# Patient Record
Sex: Male | Born: 1945 | Race: White | Hispanic: No | Marital: Married | Smoking: Never smoker
Health system: Southern US, Community
[De-identification: ages and names within clinical notes are randomized; demographics above are authoritative.]

## PROBLEM LIST (undated history)

## (undated) DIAGNOSIS — K219 Gastro-esophageal reflux disease without esophagitis: Secondary | ICD-10-CM

## (undated) DIAGNOSIS — E785 Hyperlipidemia, unspecified: Secondary | ICD-10-CM

## (undated) DIAGNOSIS — IMO0001 Reserved for inherently not codable concepts without codable children: Secondary | ICD-10-CM

## (undated) DIAGNOSIS — M109 Gout, unspecified: Secondary | ICD-10-CM

## (undated) DIAGNOSIS — E079 Disorder of thyroid, unspecified: Secondary | ICD-10-CM

## (undated) HISTORY — PX: CERVICAL FUSION: SHX112

## (undated) HISTORY — PX: KNEE ARTHROSCOPY: SUR90

## (undated) HISTORY — PX: THYROIDECTOMY, PARTIAL: SHX18

## (undated) HISTORY — PX: RHINOPLASTY: SUR1284

---

## 2003-08-26 HISTORY — PX: HERNIA REPAIR: SHX51

## 2006-08-25 HISTORY — PX: FOOT SURGERY: SHX648

## 2008-08-25 HISTORY — PX: LAMINECTOMY AND MICRODISCECTOMY LUMBAR SPINE: SHX1913

## 2008-10-11 ENCOUNTER — Ambulatory Visit (HOSPITAL_BASED_OUTPATIENT_CLINIC_OR_DEPARTMENT_OTHER): Admission: RE | Admit: 2008-10-11 | Discharge: 2008-10-11 | Payer: Self-pay | Admitting: Orthopedic Surgery

## 2009-04-13 ENCOUNTER — Encounter: Admission: RE | Admit: 2009-04-13 | Discharge: 2009-04-13 | Payer: Self-pay | Admitting: Orthopedic Surgery

## 2009-05-08 ENCOUNTER — Ambulatory Visit: Payer: Self-pay | Admitting: Family Medicine

## 2009-05-08 DIAGNOSIS — J069 Acute upper respiratory infection, unspecified: Secondary | ICD-10-CM | POA: Insufficient documentation

## 2009-05-18 ENCOUNTER — Ambulatory Visit: Payer: Self-pay | Admitting: Family Medicine

## 2009-05-18 DIAGNOSIS — M25539 Pain in unspecified wrist: Secondary | ICD-10-CM | POA: Insufficient documentation

## 2009-05-23 ENCOUNTER — Ambulatory Visit: Payer: Self-pay | Admitting: Family Medicine

## 2009-05-23 DIAGNOSIS — J309 Allergic rhinitis, unspecified: Secondary | ICD-10-CM | POA: Insufficient documentation

## 2009-05-23 DIAGNOSIS — E785 Hyperlipidemia, unspecified: Secondary | ICD-10-CM | POA: Insufficient documentation

## 2009-09-26 ENCOUNTER — Ambulatory Visit: Payer: Self-pay | Admitting: Family Medicine

## 2009-10-18 ENCOUNTER — Ambulatory Visit (HOSPITAL_COMMUNITY): Admission: RE | Admit: 2009-10-18 | Discharge: 2009-10-20 | Payer: Self-pay | Admitting: Orthopedic Surgery

## 2009-10-24 ENCOUNTER — Inpatient Hospital Stay (HOSPITAL_COMMUNITY): Admission: EM | Admit: 2009-10-24 | Discharge: 2009-10-27 | Payer: Self-pay | Admitting: Emergency Medicine

## 2010-09-16 ENCOUNTER — Encounter: Payer: Self-pay | Admitting: Orthopedic Surgery

## 2010-09-24 NOTE — Assessment & Plan Note (Signed)
Summary: neck pain x last night rm 3   Vital Signs:  Patient Profile:   65 Years Old Male CC:      neck pain x last night Height:     71 inches Weight:      223 pounds O2 Sat:      100 % O2 treatment:    Room Air Temp:     98.3 degrees F oral Pulse rate:   80 / minute Pulse rhythm:   regular Resp:     16 per minute BP sitting:   129 / 80  (right arm) Cuff size:   regular  Vitals Entered By: Areta Haber CMA (September 26, 2009 6:12 PM)                  Prior Medication List:  FLOMAX 0.4 MG CAPS (TAMSULOSIN HCL) 1 qd CRESTOR 40 MG TABS (ROSUVASTATIN CALCIUM) 1qd LEVOTHROID 125 MCG TABS (LEVOTHYROXINE SODIUM) 1qd CIMETIDINE 200 MG TABS (CIMETIDINE) 1qd TUSSICAPS 10-8 MG XR12H-CAP (HYDROCOD POLST-CHLORPHEN POLST) One by mouth hs as needed cough AZITHROMYCIN 250 MG TABS (AZITHROMYCIN) Two tabs by mouth on day 1, then 1 tab daily on days 2 through 5 PREDNISONE 10 MG TABS (PREDNISONE) 2 PO today, then 2 BID for 2 days, then 1 two times a day for 2 days, then 1 daily for 2 days.  Take PC LORTAB 5 5-500 MG TABS (HYDROCODONE-ACETAMINOPHEN) One or two tabs by mouth hs as needed pain COLCHICINE 0.6 MG TABS (COLCHICINE) 1 by mouth 2-4x aday next 2-3 days and then 1-2 x a day OXYCODONE-ACETAMINOPHEN 5-325 MG TABS (OXYCODONE-ACETAMINOPHEN) 1 by mouth up to 4 times per day as needed for pain prn   Current Allergies: ! PENICILLIN ! ASA  History of Present Illness Chief Complaint: neck pain x last night History of Present Illness: Started having some pain on the Rside o his neck but today the pain has moved to the L side and much worse. He has difficulty turning his head. he has had cervical spine fusion in 94-95 C4-C5 He had a MRI in April and a mylegram this fall.  Current Problems: MYALGIA/MYOSITIS (ICD-729.1) MUSCLE SPASM (ICD-728.85) ALLERGIC RHINITIS (ICD-477.9) HYPERLIPIDEMIA (ICD-272.4) WRIST PAIN, LEFT (ICD-719.43) URI (ICD-465.9)   Current Meds FLOMAX 0.4 MG CAPS  (TAMSULOSIN HCL) 1 qd CRESTOR 40 MG TABS (ROSUVASTATIN CALCIUM) 1qd LEVOTHROID 125 MCG TABS (LEVOTHYROXINE SODIUM) 1qd CIMETIDINE 200 MG TABS (CIMETIDINE) 1qd HYDROCODONE-ACETAMINOPHEN 5-500 MG TABS (HYDROCODONE-ACETAMINOPHEN) 1 tab Q4-6 hrs prn HYDROCODONE-ACETAMINOPHEN 5-325 MG TABS (HYDROCODONE-ACETAMINOPHEN) sig 1po q6-8hrs as needed ORPHENADRINE CITRATE CR 100 MG XR12H-TAB (ORPHENADRINE CITRATE) 1 by mouth twice adqay  REVIEW OF SYSTEMS Constitutional Symptoms      Denies fever, chills, night sweats, weight loss, weight gain, and fatigue.  Eyes       Denies change in vision, eye pain, eye discharge, glasses, contact lenses, and eye surgery. Ear/Nose/Throat/Mouth       Denies hearing loss/aids, change in hearing, ear pain, ear discharge, dizziness, frequent runny nose, frequent nose bleeds, sinus problems, sore throat, hoarseness, and tooth pain or bleeding.  Respiratory       Denies dry cough, productive cough, wheezing, shortness of breath, asthma, bronchitis, and emphysema/COPD.  Cardiovascular       Denies murmurs, chest pain, and tires easily with exhertion.    Gastrointestinal       Denies stomach pain, nausea/vomiting, diarrhea, constipation, blood in bowel movements, and indigestion. Genitourniary       Denies painful urination, kidney stones, and loss  of urinary control. Neurological       Denies paralysis, seizures, and fainting/blackouts. Musculoskeletal       Complains of muscle pain, joint pain, and decreased range of motion.      Denies joint stiffness, redness, swelling, muscle weakness, and gout.      Comments: neck pain x last night Skin       Denies bruising, unusual mles/lumps or sores, and hair/skin or nail changes.  Psych       Denies mood changes, temper/anger issues, anxiety/stress, speech problems, depression, and sleep problems.  Past History:  Past Medical History: Last updated: 05/23/2009 Thyroid disease Hyperlipidemia Prostatic Acid  Reflux  Past Surgical History: Last updated: 05/23/2009 C6-7 fusion Thyroidectomy - hemi Knee surgery - March/2010 Physical Exam General appearance: well developed, well nourished, moderate distress Head: normocephalic, atraumatic Neck: tenderness over the muscle on the L side Skin: seborrhea present MSE: oriented to time, place, and person Assessment New Problems: MYALGIA/MYOSITIS (ICD-729.1) MUSCLE SPASM (ICD-728.85)  muscle spasm   Patient Education: Patient and/or caregiver instructed in the following: rest. ice pack  Plan New Medications/Changes: ORPHENADRINE CITRATE CR 100 MG XR12H-TAB (ORPHENADRINE CITRATE) 1 by mouth twice adqay  #30 x 0, 09/26/2009, Hassan Rowan MD HYDROCODONE-ACETAMINOPHEN 5-325 MG TABS (HYDROCODONE-ACETAMINOPHEN) sig 1po q6-8hrs as needed  #20 x 0, 09/26/2009, Hassan Rowan MD  New Orders: Est. Patient Level III [[address]109] Admin of Therapeutic Inj (IM or [place]) [60454] Ketorolac-Toradol 15mg  [J1885] Admin of Therapeutic Inj  intramuscular or subcutaneous [96372] Planning Comments:   continue w/antinflamatory and addmuscle relaxant  Follow Up: Follow up in 2-3 days if no improvement, Follow up on an as needed basis, Follow up with Primary Physician  The patient and/or caregiver has been counseled thoroughly with regard to medications prescribed including dosage, schedule, interactions, rationale for use, and possible side effects and they verbalize understanding.  Diagnoses and expected course of recovery discussed and will return if not improved as expected or if the condition worsens. Patient and/or caregiver verbalized understanding.  Prescriptions: ORPHENADRINE CITRATE CR 100 MG XR12H-TAB (ORPHENADRINE CITRATE) 1 by mouth twice adqay  #30 x 0   Entered and Authorized by:   Hassan Rowan MD   Signed by:   Hassan Rowan MD on 09/26/2009   Method used:   Printed then faxed to ...       [facility] [address] [phone]* (retail)       [address]       [place], [place]  [address]       Ph: [phone]       Fax: [phone]   RxID:   [id number] HYDROCODONE-ACETAMINOPHEN 5-325 MG TABS (HYDROCODONE-ACETAMINOPHEN) sig 1po q6-8hrs as needed  #20 x 0   Entered and Authorized by:   Hassan Rowan MD   Signed by:   Hassan Rowan MD on 09/26/2009   Method used:   Printed then faxed to ...       [facility] [address] [phone]* (retail)       [address]       [place], [place]  [address]       Ph: [phone]       Fax: [phone]   RxID:   [id number]   Patient Instructions: 1)  Please schedule a follow-up appointment as needed. 2)  Please schedule an appointment with your primary doctor in :as scheduled next weekfor back follow up 3)  Most patients (90%) with low back pain will improve with time (2-6 weeks).  Keep active but avoid activities that are painful. Apply moist heat and/or ice to lower back several times a day. 4)  use ice and heat for muscle spasm   Medication Administration  Injection # 1:    Medication: Ketorolac-Toradol 15mg     Diagnosis: MYALGIA/MYOSITIS (ICD-729.1)    Route: IM    Site: LUOQ gluteus    Exp Date: 09/31/2012    Lot #: [id number]    Mfr: Baxter    Comments: Administered 60 mg    Patient tolerated injection without complications    Given by: Areta Haber CMA (September 26, 2009 7:05 PM)  Orders Added: 1)  Est. Patient Level III [81191] 2)  Admin of Therapeutic Inj (IM or [place]) [47829] 3)  Ketorolac-Toradol 15mg  [J1885] 4)  Admin of Therapeutic Inj  intramuscular or subcutaneous [56213]

## 2010-11-13 LAB — COMPREHENSIVE METABOLIC PANEL
ALT: 26 U/L (ref 0–53)
Alkaline Phosphatase: 67 U/L (ref 39–117)
Calcium: 8.6 mg/dL (ref 8.4–10.5)
Chloride: 106 mEq/L (ref 96–112)
Glucose, Bld: 105 mg/dL — ABNORMAL HIGH (ref 70–99)
Potassium: 4.1 mEq/L (ref 3.5–5.1)
Total Bilirubin: 0.9 mg/dL (ref 0.3–1.2)
Total Protein: 6.9 g/dL (ref 6.0–8.3)

## 2010-11-18 LAB — HEPATITIS PANEL, ACUTE
Hep B C IgM: NEGATIVE
Hepatitis B Surface Ag: NEGATIVE

## 2010-11-18 LAB — DIFFERENTIAL
Basophils Absolute: 0 10*3/uL (ref 0.0–0.1)
Basophils Relative: 0 % (ref 0–1)
Eosinophils Absolute: 0 10*3/uL (ref 0.0–0.7)
Eosinophils Relative: 1 % (ref 0–5)
Lymphocytes Relative: 17 % (ref 12–46)
Lymphocytes Relative: 9 % — ABNORMAL LOW (ref 12–46)
Lymphs Abs: 1.4 10*3/uL (ref 0.7–4.0)
Monocytes Absolute: 1 10*3/uL (ref 0.1–1.0)
Monocytes Relative: 12 % (ref 3–12)
Neutro Abs: 7.8 10*3/uL — ABNORMAL HIGH (ref 1.7–7.7)

## 2010-11-18 LAB — COMPREHENSIVE METABOLIC PANEL
ALT: 134 U/L — ABNORMAL HIGH (ref 0–53)
ALT: 141 U/L — ABNORMAL HIGH (ref 0–53)
ALT: 199 U/L — ABNORMAL HIGH (ref 0–53)
AST: 180 U/L — ABNORMAL HIGH (ref 0–37)
AST: 322 U/L — ABNORMAL HIGH (ref 0–37)
AST: 78 U/L — ABNORMAL HIGH (ref 0–37)
Albumin: 2.5 g/dL — ABNORMAL LOW (ref 3.5–5.2)
Albumin: 2.5 g/dL — ABNORMAL LOW (ref 3.5–5.2)
Albumin: 2.8 g/dL — ABNORMAL LOW (ref 3.5–5.2)
Alkaline Phosphatase: 407 U/L — ABNORMAL HIGH (ref 39–117)
Alkaline Phosphatase: 436 U/L — ABNORMAL HIGH (ref 39–117)
BUN: 61 mg/dL — ABNORMAL HIGH (ref 6–23)
CO2: 28 mEq/L (ref 19–32)
Calcium: 8.2 mg/dL — ABNORMAL LOW (ref 8.4–10.5)
Calcium: 8.9 mg/dL (ref 8.4–10.5)
Chloride: 110 mEq/L (ref 96–112)
Creatinine, Ser: 3.58 mg/dL — ABNORMAL HIGH (ref 0.4–1.5)
GFR calc Af Amer: 16 mL/min — ABNORMAL LOW (ref >60)
GFR calc Af Amer: 21 mL/min — ABNORMAL LOW (ref >60)
GFR calc Af Amer: 55 mL/min — ABNORMAL LOW (ref >60)
GFR calc non Af Amer: 45 mL/min — ABNORMAL LOW (ref >60)
Glucose, Bld: 123 mg/dL — ABNORMAL HIGH (ref 70–99)
Potassium: 3.6 mEq/L (ref 3.5–5.1)
Sodium: 143 mEq/L (ref 135–145)
Sodium: 146 mEq/L — ABNORMAL HIGH (ref 135–145)
Total Bilirubin: 0.7 mg/dL (ref 0.3–1.2)
Total Protein: 6.1 g/dL (ref 6.0–8.3)
Total Protein: 7.2 g/dL (ref 6.0–8.3)

## 2010-11-18 LAB — HEPATIC FUNCTION PANEL
ALT: 197 U/L — ABNORMAL HIGH (ref 0–53)
AST: 178 U/L — ABNORMAL HIGH (ref 0–37)
Albumin: 2.3 g/dL — ABNORMAL LOW (ref 3.5–5.2)
Alkaline Phosphatase: 298 U/L — ABNORMAL HIGH (ref 39–117)
Total Protein: 5.4 g/dL — ABNORMAL LOW (ref 6.0–8.3)

## 2010-11-18 LAB — FERRITIN: Ferritin: 719 ng/mL — ABNORMAL HIGH (ref 22–322)

## 2010-11-18 LAB — LIPID PANEL
HDL: 10 mg/dL — ABNORMAL LOW (ref >39)
LDL Cholesterol: 64 mg/dL (ref 0–99)
Total CHOL/HDL Ratio: 13.3 RATIO
Triglycerides: 294 mg/dL — ABNORMAL HIGH (ref <150)
VLDL: 59 mg/dL — ABNORMAL HIGH (ref 0–40)

## 2010-11-18 LAB — SEDIMENTATION RATE: Sed Rate: 120 mm/hr — ABNORMAL HIGH (ref 0–16)

## 2010-11-18 LAB — CBC
HCT: 30.6 % — ABNORMAL LOW (ref 39.0–52.0)
HCT: 31.5 % — ABNORMAL LOW (ref 39.0–52.0)
HCT: 34.1 % — ABNORMAL LOW (ref 39.0–52.0)
Hemoglobin: 11.7 g/dL — ABNORMAL LOW (ref 13.0–17.0)
MCHC: 33.3 g/dL (ref 30.0–36.0)
MCV: 86.6 fL (ref 78.0–100.0)
MCV: 87.3 fL (ref 78.0–100.0)
Platelets: 287 10*3/uL (ref 150–400)
Platelets: 303 10*3/uL (ref 150–400)
RDW: 13 % (ref 11.5–15.5)
RDW: 13.1 % (ref 11.5–15.5)

## 2010-11-18 LAB — CK: Total CK: 52 U/L (ref 7–232)

## 2010-11-18 LAB — URINE MICROSCOPIC-ADD ON

## 2010-11-18 LAB — URINALYSIS, ROUTINE W REFLEX MICROSCOPIC
Bilirubin Urine: NEGATIVE
Ketones, ur: NEGATIVE mg/dL
Leukocytes, UA: NEGATIVE
Nitrite: NEGATIVE
Specific Gravity, Urine: 1.02 (ref 1.005–1.030)
Urobilinogen, UA: 1 mg/dL (ref 0.0–1.0)
pH: 5.5 (ref 5.0–8.0)

## 2010-11-18 LAB — RENAL FUNCTION PANEL
CO2: 27 mEq/L (ref 19–32)
Glucose, Bld: 93 mg/dL (ref 70–99)
Potassium: 3.6 mEq/L (ref 3.5–5.1)
Sodium: 145 mEq/L (ref 135–145)

## 2010-11-18 LAB — FOLATE: Folate: >20 ng/mL

## 2010-11-18 LAB — RETICULOCYTES: RBC.: 3.66 MIL/uL — ABNORMAL LOW (ref 4.22–5.81)

## 2010-11-18 LAB — IRON AND TIBC: UIBC: 167 ug/dL

## 2010-11-18 LAB — VITAMIN B12: Vitamin B-12: 590 pg/mL (ref 211–911)

## 2010-11-18 LAB — ACETAMINOPHEN LEVEL: Acetaminophen (Tylenol), Serum: <10 ug/mL — ABNORMAL LOW (ref 10–30)

## 2010-11-18 LAB — URIC ACID: Uric Acid, Serum: 16.2 mg/dL — ABNORMAL HIGH (ref 4.0–7.8)

## 2010-11-18 LAB — LIPASE, BLOOD: Lipase: 29 U/L (ref 11–59)

## 2011-01-07 NOTE — Op Note (Signed)
[name], Jorge Petersen                ACCOUNT NO.:  [id number]   MEDICAL RECORD NO.:  [id number]          PATIENT TYPE:  AMB   LOCATION:  [facility]                         FACILITY:  [facility]   PHYSICIAN:  Marlowe Kays, M.D.  DATE OF BIRTH:  1946/02/25   DATE OF PROCEDURE:  10/11/2008  DATE OF DISCHARGE:                               OPERATIVE REPORT   PREOPERATIVE DIAGNOSES:  1. Posterior horn tear medial meniscus.  2. Osteoarthritis.  3. Chondrocalcinosis left knee.   POSTOPERATIVE DIAGNOSES:  1. Posterior horn tear medial meniscus.  2. Osteoarthritis.  3. Chondrocalcinosis left knee.   OPERATION:  Left knee arthroscopy with:  1. Partial medial meniscectomy.  2. Shaving of medial femoral condyle.  3. Debridement of patella.   SURGEON:  J. Aplington, M.D.   ASSISTANT:  Nurse   ANESTHESIA:  General.   FINAL JUSTIFICATION FOR PROCEDURE:  He had an on the job injury with an  MRI of his left knee on the September 07, 2008 demonstrating the above  preoperative diagnoses.   PROCEDURE:  Satisfactory general anesthesia, Ace wrap and knee support  to right lower extremity, pneumatic tourniquet to left lower extremity  with the leg Esmarched out non-sterilely and tourniquet inflated to 350  mmHg.  A thigh stabilizer applied and the left leg prepped with DuraPrep  from stabilizer to ankle and draped in a sterile field.  Superomedial  saline inflow.  First, through an anterolateral portal, the medial  compartment of the knee joint was evaluated.  Marked chondrocalcinosis  was noted with marked degenerative type tear involving the entire  posterior 40% of the medial meniscus.  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  I  resected the medial meniscus tear debriding out a lot of the meniscus  back to stable rim with combinations of baskets and a 3.5 shaver.  Final  pictures were taken.  The ACL was normal. Then looking at the medial  gutter and suprapatellar area, his patella had a good bit of wear which  I pictured and shaved down until smooth with a 3.5 shaver.  I also took  a picture of the trochlear notch which had a cobblestone appearance.  On  reversing portals, the lateral compartment of the knee joint had a good  bit of chondrocalcinosis, but nothing that was really treatable  arthroscopically.  I then irrigated the knee joint to clear and all  fluid possibly removed.  The three entry portals were closed with 4-0  nylon and I then injected 20 mL half percent Marcaine with adrenaline  and 4 mg of morphine through the inflow apparatus which was removed.  This portal was closed with 4-0 nylon as well.  Betadine, Adaptic and  dry sterile dressing were applied.  The tourniquet was released.  He  tolerated the procedure well and was taken to the recovery room in  satisfactory condition with no known complications.           ______________________________  Marlowe Kays, M.D.  JA/MEDQ  D:  10/11/2008  T:  10/11/2008  Job:  [id number]

## 2011-05-26 ENCOUNTER — Encounter: Payer: Self-pay | Admitting: Family Medicine

## 2011-05-26 ENCOUNTER — Inpatient Hospital Stay (INDEPENDENT_AMBULATORY_CARE_PROVIDER_SITE_OTHER)
Admission: RE | Admit: 2011-05-26 | Discharge: 2011-05-26 | Disposition: A | Discharge status: Home / Self Care | Payer: 59 | Source: Ambulatory Visit | Attending: Family Medicine | Admitting: Family Medicine

## 2011-05-26 DIAGNOSIS — J209 Acute bronchitis, unspecified: Secondary | ICD-10-CM

## 2011-05-26 DIAGNOSIS — R51 Headache: Secondary | ICD-10-CM

## 2011-05-26 DIAGNOSIS — J069 Acute upper respiratory infection, unspecified: Secondary | ICD-10-CM

## 2011-05-29 ENCOUNTER — Telehealth (INDEPENDENT_AMBULATORY_CARE_PROVIDER_SITE_OTHER): Payer: Self-pay | Admitting: *Deleted

## 2011-07-28 NOTE — Progress Notes (Signed)
Summary: Possible Strep Throat (rm 5)   Vital Signs:  Patient Profile:   66 Years Old Male CC:      sore throat, dry cough, HA Height:     71 inches Weight:      221 pounds O2 Sat:      97 % O2 treatment:    Room Air Temp:     99.6 degrees F oral Pulse rate:   98 / minute Resp:     18 per minute BP sitting:   122 / 85  (left arm) Cuff size:   large  Vitals Entered By: Lajean Saver RN (May 26, 2011 9:21 AM)                  Updated Prior Medication List: FLOMAX 0.4 MG CAPS (TAMSULOSIN HCL) 1 qd CRESTOR 40 MG TABS (ROSUVASTATIN CALCIUM) 1qd LEVOTHROID 125 MCG TABS (LEVOTHYROXINE SODIUM) 1qd HYDROCODONE-ACETAMINOPHEN 5-500 MG TABS (HYDROCODONE-ACETAMINOPHEN) 1 tab Q4-6 hrs prn PROTONIX 40 MG TBEC (PANTOPRAZOLE SODIUM)   Current Allergies (reviewed today): ! PENICILLIN ! ASA ! MORPHINEHistory of Present Illness Chief Complaint: sore throat, dry cough, HA History of Present Illness:  Subjective: Patient complains of "scratchy" throat that started two days ago followed by mild sinus congestion and cough.  The cough has become worse today.  He notes that he often coughs until he gags.  His cough is worse at night.   No pleuritic pain No wheezing + mild nasal congestion ? post-nasal drainage No sinus pain/pressure No itchy/red eyes, but has increased lacrimation No earache No hemoptysis No SOB No fever/chills, but has felt warm No nausea No vomiting No abdominal pain No diarrhea No skin rashes + fatigue + myalgias + headache Used OTC meds without relief   REVIEW OF SYSTEMS Constitutional Symptoms      Denies fever, chills, night sweats, weight loss, weight gain, and fatigue.  Eyes       Denies change in vision, eye pain, eye discharge, glasses, contact lenses, and eye surgery. Ear/Nose/Throat/Mouth       Complains of sore throat.      Denies hearing loss/aids, change in hearing, ear pain, ear discharge, dizziness, frequent runny nose, frequent nose  bleeds, sinus problems, hoarseness, and tooth pain or bleeding.  Respiratory       Complains of productive cough.      Denies dry cough, wheezing, shortness of breath, asthma, bronchitis, and emphysema/COPD.  Cardiovascular       Denies murmurs, chest pain, and tires easily with exhertion.    Gastrointestinal       Denies stomach pain, nausea/vomiting, diarrhea, constipation, blood in bowel movements, and indigestion. Genitourniary       Denies painful urination, blood or discharge from penis, kidney stones, and loss of urinary control. Neurological       Denies paralysis, seizures, and fainting/blackouts. Musculoskeletal       Denies muscle pain, joint pain, joint stiffness, decreased range of motion, redness, swelling, muscle weakness, and gout.  Skin       Denies bruising, unusual mles/lumps or sores, and hair/skin or nail changes.  Psych       Denies mood changes, temper/anger issues, anxiety/stress, speech problems, depression, and sleep problems.  Past History:  Past Medical History: Thyroid disease Hyperlipidemia Prostatic Acid Reflux arthritis  Past Surgical History: C6-7 fusion Thyroidectomy - hemi Knee surgery - March/2010- left knee  Social History: Never Smoked Alcohol use-no Drug use-no Smoking Status:  never Drug Use:  no   Objective:  No acute distress  Eyes:  Pupils are equal, round, and reactive to light and accomodation.  Extraocular movement is intact.  Conjunctivae are not inflamed.  Ears:  Canals normal.  Tympanic membranes normal.   Nose:  Mildly congested turbinates.  No sinus tenderness  Pharynx:  Normal  Neck:  Supple.  Slightly tender shotty posterior nodes are palpated bilaterally.  Lungs:  Clear to auscultation.  Breath sounds are equal.  Heart:  Regular rate and rhythm without murmurs, rubs, or gallops.  Abdomen:  Nontender without masses or hepatosplenomegaly.  Bowel sounds are present.  No CVA or flank tenderness.  Extremities:  No  edema. Skin:  No rash Assessment New Problems: BRONCHITIS, ACUTE (ICD-466.0) RESPIRATORY DISORDER, ACUTE (ICD-465.9)  ? PERTUSSIS  Plan New Medications/Changes: BENZONATATE 200 MG CAPS (BENZONATATE) One by mouth hs as needed cough  #12 x 0, 05/26/2011, Donna Christen MD AZITHROMYCIN 250 MG TABS (AZITHROMYCIN) Two tabs by mouth on day 1, then 1 tab daily on days 2 through 5  #6 tabs x 0, 05/26/2011, Donna Christen MD  New Orders: Pulse Oximetry (single measurment) [94760] Est. Patient Level IV [[address]109] Planning Comments:   Begin Z-pack, expectorant/decongestant, cough suppressant at bedtime.  Increase fluid intake Recommend check status of Tdap Followup with PCP if not improving 7 to 10 days   The patient and/or caregiver has been counseled thoroughly with regard to medications prescribed including dosage, schedule, interactions, rationale for use, and possible side effects and they verbalize understanding.  Diagnoses and expected course of recovery discussed and will return if not improved as expected or if the condition worsens. Patient and/or caregiver verbalized understanding.  Prescriptions: BENZONATATE 200 MG CAPS (BENZONATATE) One by mouth hs as needed cough  #12 x 0   Entered and Authorized by:   Donna Christen MD   Signed by:   Donna Christen MD on 05/26/2011   Method used:   Print then Give to Patient   RxID:   [id number] AZITHROMYCIN 250 MG TABS (AZITHROMYCIN) Two tabs by mouth on day 1, then 1 tab daily on days 2 through 5  #6 tabs x 0   Entered and Authorized by:   Donna Christen MD   Signed by:   Donna Christen MD on 05/26/2011   Method used:   Print then Give to Patient   RxID:   [id number]   Orders Added: 1)  Pulse Oximetry (single measurment) [94760] 2)  Est. Patient Level IV [29528]

## 2011-07-28 NOTE — Progress Notes (Signed)
Summary: Pt came back to office symptoms worse/nh   Vital Signs:  Patient Profile:   65 Years Old Male CC:      Headache Height:     71 inches O2 Sat:      97 % O2 treatment:    Room Air Temp:     100.2 degrees F oral Pulse rate:   95 / minute Resp:     20 per minute BP sitting:   147 / 61  (left arm) Cuff size:   regular  Vitals Entered By: Lavell Islam RN (May 26, 2011 6:02 PM)                  Updated Prior Medication List: FLOMAX 0.4 MG CAPS (TAMSULOSIN HCL) 1 qd CRESTOR 40 MG TABS (ROSUVASTATIN CALCIUM) 1qd LEVOTHROID 125 MCG TABS (LEVOTHYROXINE SODIUM) 1qd CIMETIDINE 200 MG TABS (CIMETIDINE) 1qd HYDROCODONE-ACETAMINOPHEN 5-500 MG TABS (HYDROCODONE-ACETAMINOPHEN) 1 tab Q4-6 hrs prn HYDROCODONE-ACETAMINOPHEN 5-325 MG TABS (HYDROCODONE-ACETAMINOPHEN) sig 1po q6-8hrs as needed ORPHENADRINE CITRATE CR 100 MG XR12H-TAB (ORPHENADRINE CITRATE) 1 by mouth twice adqay AZITHROMYCIN 250 MG TABS (AZITHROMYCIN) Two tabs by mouth on day 1, then 1 tab daily on days 2 through 5 BENZONATATE 200 MG CAPS (BENZONATATE) One by mouth hs as needed cough  Current Allergies (reviewed today): ! PENICILLIN ! ASA ! MORPHINEHistory of Present Illness Chief Complaint: Headache History of Present Illness:  Subjective:  Patient returns to office this afternoon reporting that he has been coughing so much that he has developed hiccups and persistent headache, worse with cough.  He has imbibed about 9 glasses of water attempting to stop his hiccups.  No neurologic symptoms.  No vomiting.  No shortness of breath or pleuritic pain   REVIEW OF SYSTEMS Constitutional Symptoms      Denies fever, chills, night sweats, weight loss, weight gain, and fatigue.  Eyes       Denies change in vision, eye pain, eye discharge, glasses, contact lenses, and eye surgery. Ear/Nose/Throat/Mouth       Denies hearing loss/aids, change in hearing, ear pain, ear discharge, dizziness, frequent runny nose, frequent  nose bleeds, sinus problems, sore throat, hoarseness, and tooth pain or bleeding.  Respiratory       Denies dry cough, productive cough, wheezing, shortness of breath, asthma, bronchitis, and emphysema/COPD.  Cardiovascular       Denies murmurs, chest pain, and tires easily with exhertion.    Gastrointestinal       Denies stomach pain, nausea/vomiting, diarrhea, constipation, blood in bowel movements, and indigestion. Genitourniary       Denies painful urination, kidney stones, and loss of urinary control. Neurological       Complains of headaches.      Denies paralysis, seizures, and fainting/blackouts. Musculoskeletal       Denies muscle pain, joint pain, joint stiffness, decreased range of motion, redness, swelling, muscle weakness, and gout.  Skin       Denies bruising, unusual mles/lumps or sores, and hair/skin or nail changes.  Psych       Denies mood changes, temper/anger issues, anxiety/stress, speech problems, depression, and sleep problems. Other Comments: pt returned to the clinic @ 1715 c/o HA, hiccups and increased congestion since his visit this morning. he states that he took tylenol, ABT, Robt, and Benazontate @ 1130. He took tylenol again @ 1700.    Past History:  Past Medical History: Reviewed history from 05/23/2009 and no changes required. Thyroid disease Hyperlipidemia Prostatic Acid Reflux  Past Surgical  History: Reviewed history from 05/23/2009 and no changes required. C6-7 fusion Thyroidectomy - hemi Knee surgery - March/2010  Family History: Reviewed history and no changes required.  Social History: Reviewed history and no changes required.   Objective:  No acute distress but appears uncomfortable while coughing.  No hiccups at present. Eyes:  Pupils are equal, round, and reactive to light and accomodation.  Extraocular movement is intact.  Conjunctivae are not inflamed.  Mouth:  moist mucous membranes  Neck:  Supple.  No adenopathy is present.   No thyromegaly is present  Lungs:  Clear to auscultation.  Breath sounds are equal.  Heart:  Regular rate and rhythm without murmurs, rubs, or gallops.  Abdomen:  Nontender without masses or hepatosplenomegaly.  Bowel sounds are present.  No CVA or flank tenderness.  Extremities:  No edema.   Neurologic:  Cranial nerves 2 through 12 are normal.  Patellar, achilles, and elbow reflexes are normal.  Cerebellar function is intact.  Gait and station are normal.  Assessment New Problems: HEADACHE (ICD-784.0)  HICCUPS RESOLVED.  HEADACHE RESOLVED.  Plan New Orders: Admin of Injection (IM/SQ) [id number] Ketorolac-Toradol 15mg  [J1885] Est. Patient Level III [id number] Planning Comments:   Toradol 30mg  IM administered with resolution of headache. Advised to decrease water intake if develops recurrent hiccups to avoid hypo natremia. Continue present prescribed meds.    The patient and/or caregiver has been counseled thoroughly with regard to medications prescribed including dosage, schedule, interactions, rationale for use, and possible side effects and they verbalize understanding.  Diagnoses and expected course of recovery discussed and will return if not improved as expected or if the condition worsens. Patient and/or caregiver verbalized understanding.   Medication Administration  Injection # 1:    Medication: Ketorolac-Toradol 15mg     Diagnosis: ALLERGIC RHINITIS (ICD-477.9)    Route: IM    Site: LUOQ gluteus    Exp Date: 06/26/2011    Lot #: [id number]    Mfr: [email or web address]    Comments: 30 mg per Dr.Easten Maceachern    Patient tolerated injection without complications    Given by: Lavell Islam RN (May 26, 2011 6:04 PM)  Orders Added: 1)  Admin of Injection (IM/SQ) [45409] 2)  Ketorolac-Toradol 15mg  [J1885] 3)  Est. Patient Level III [81191]

## 2011-07-28 NOTE — Telephone Encounter (Signed)
  Phone Note Outgoing Call Call back at [phone]   Call placed by: Lajean Saver RN,  May 29, 2011 4:21 PM Call placed to: Patient Summary of Call: Callback: Unable to reach patient, worng number. Called wife who reports she believes he is improving. Will give him the messgae to call with questions or concerns.

## 2011-08-03 IMAGING — CT CT L SPINE W/ CM
4 of 10 series · 12 of 33 positions shown, 14 images · IV contrast (omnipaque)
Comparison: none

CLINICAL DATA: Bilateral foot numbness right worse than left.
Spinal stenosis by outside MRI.

MYELOGRAM INJECTION
TECHNIQUE: Informed consent was obtained from the patient prior to
the procedure, including potential complications of headache,
allergy, infection and pain.  A timeout procedure was performed.
With the patient prone, the lower back was prepped with Betadine.
1% Lidocaine was used for local anesthesia.  Lumbar puncture was
performed at the right L2-3 level using a 22 gauge needle with
return of clear CSF.  15 ml of Omnipaque 640was injected into the
subarachnoid space .
TECHNIQUE: Following injection of intrathecal Omnipaque contrast,
spine imaging in multiple projections was performed using
fluoroscopy.
Fluoroscopy Time: 2 minutes 30 seconds .
TECHNIQUE: CT imaging of the lumbar spine was performed after
intrathecal contrast administration.  Multiplanar CT image
reconstructions were also generated.

[Series 2: l-spine helical · axial · 0.27mm/px · z∈[-40,+32]mm · 2 of 87 slices shown, 3 images]
[im 29/87  soft-tissue]
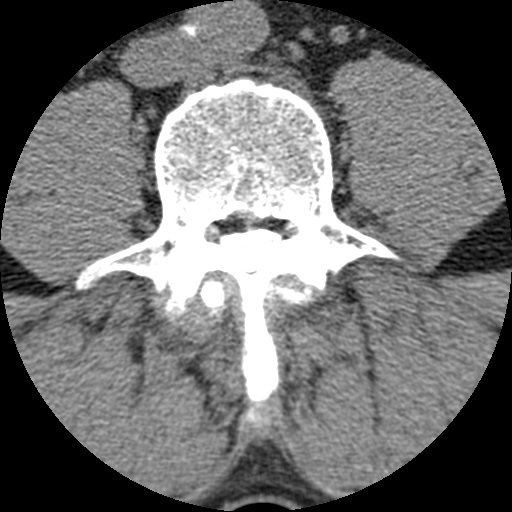
[im 29/87  bone]
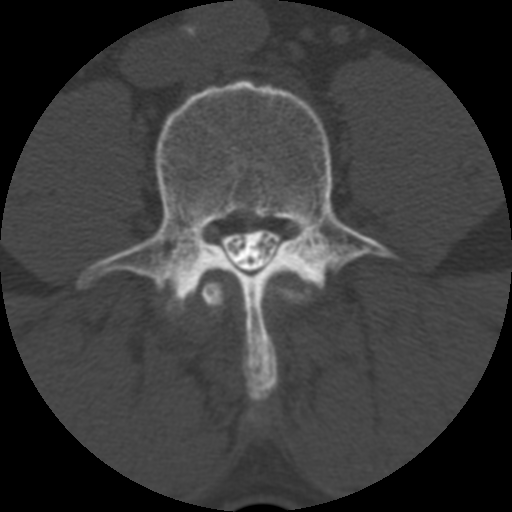
[im 58/87  bone]
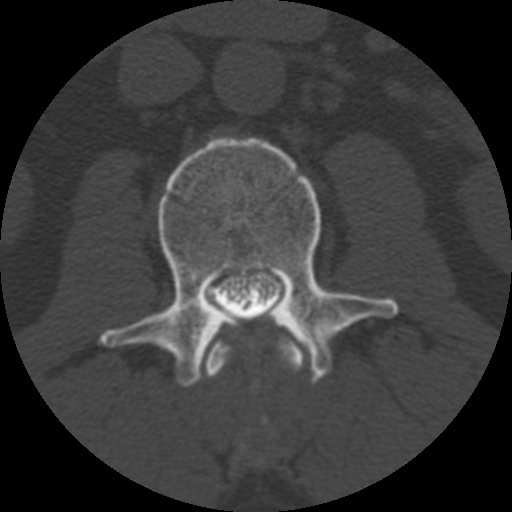

[Series 3: bone windows · axial · 0.27mm/px · z∈[-40,+32]mm · 2 of 87 slices shown]
[im 29/87  bone]
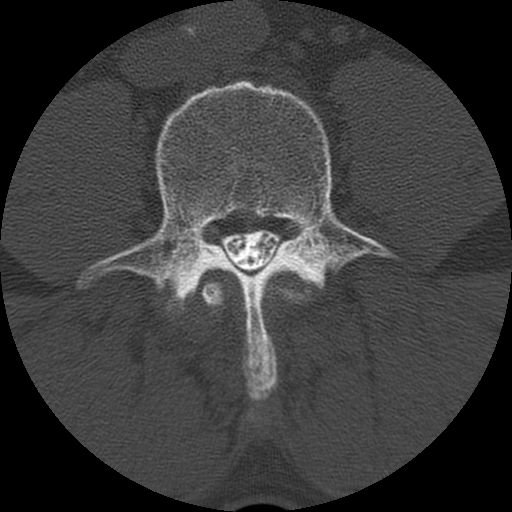
[im 58/87  bone]
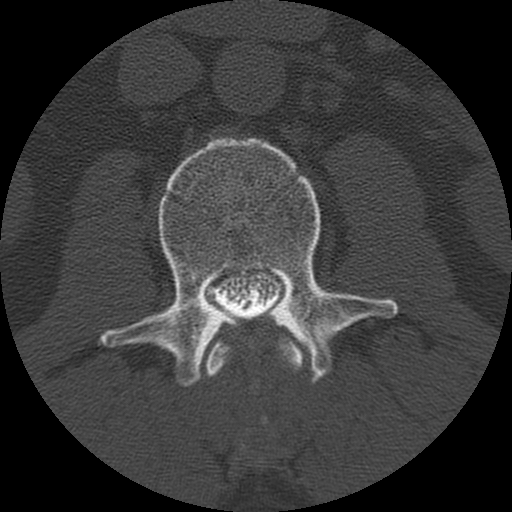

[Series 400: sag · sagittal · 0.43mm/px · 5 of 40 slices shown, 6 images]
[im 14/40  bone]
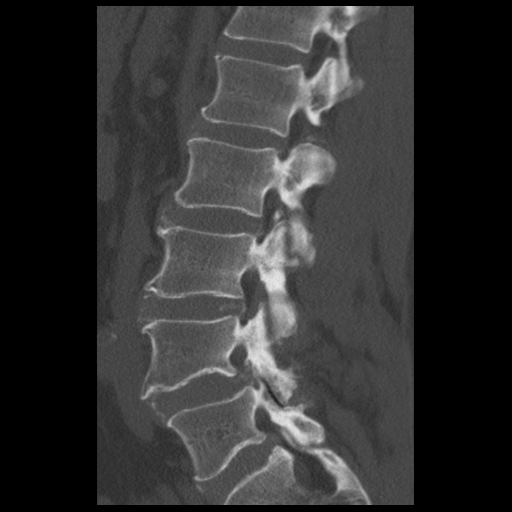
[im 17/40  bone]
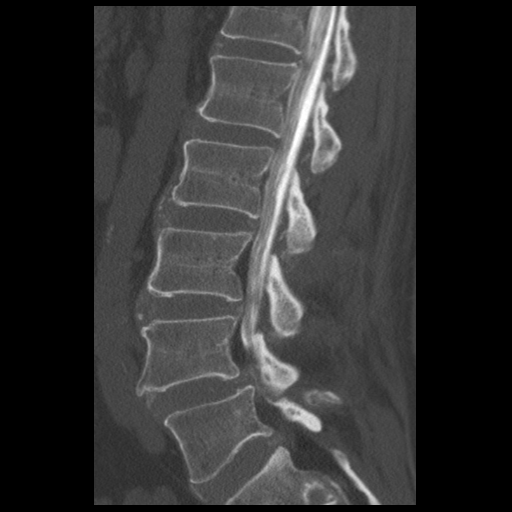
[im 20/40  soft-tissue]
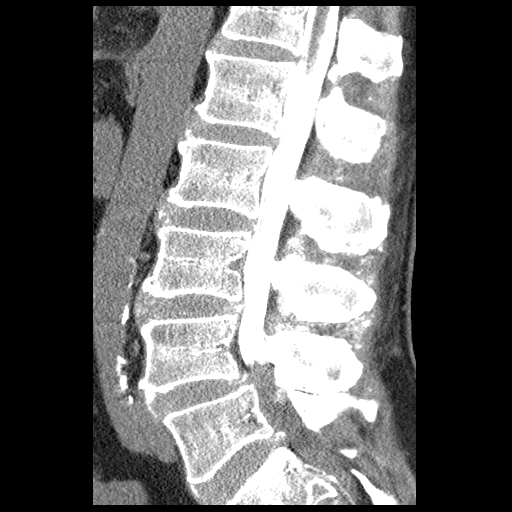
[im 20/40  bone]
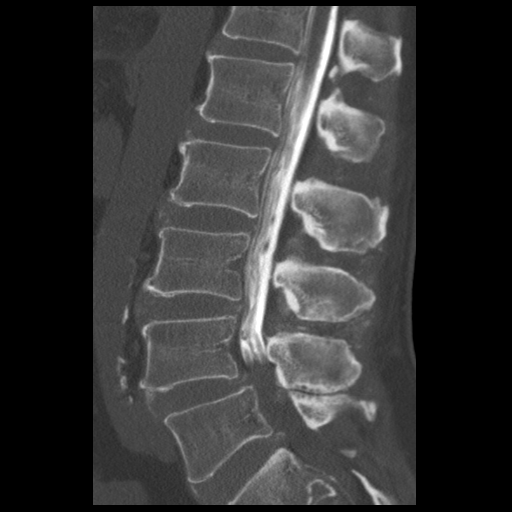
[im 23/40  bone]
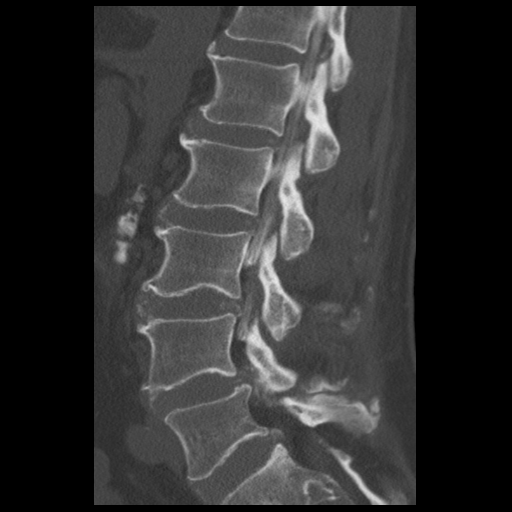
[im 27/40  bone]
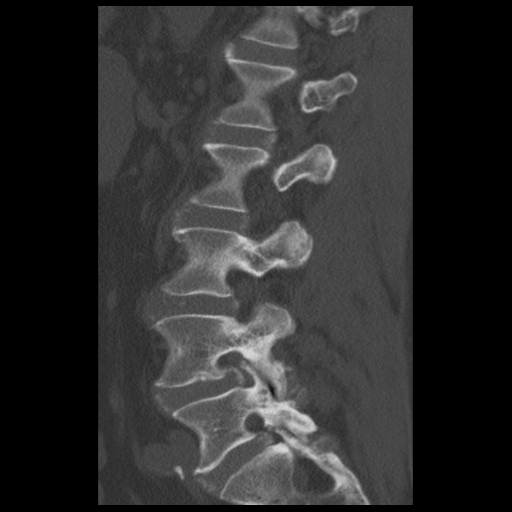

[Series 401: cor · coronal · 0.43mm/px · 3 of 40 slices shown]
[im 8/40  bone]
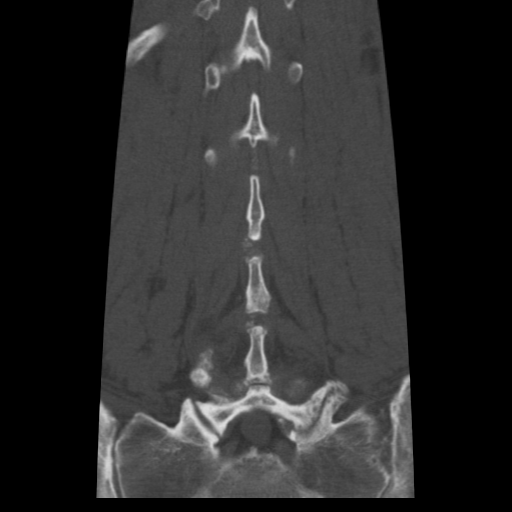
[im 16/40  bone]
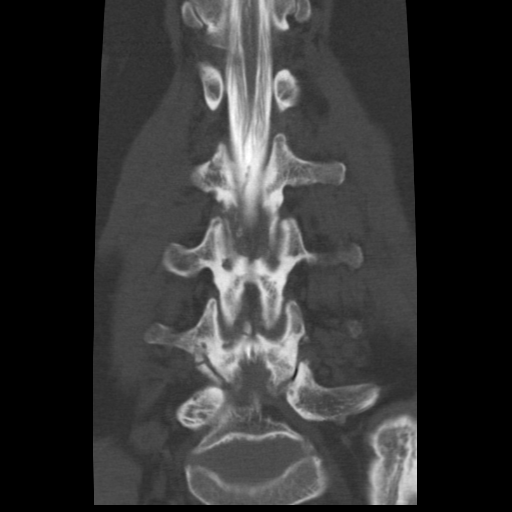
[im 24/40  bone]
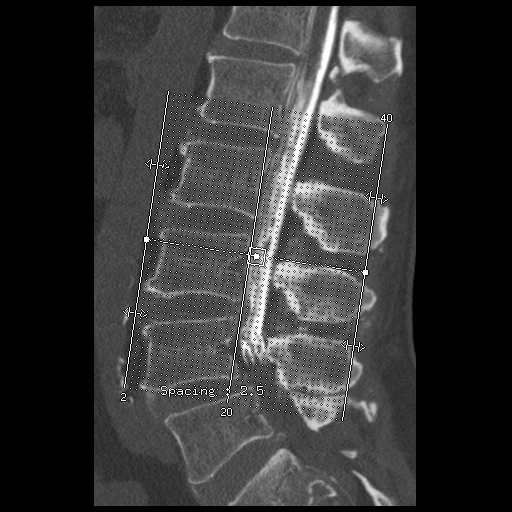

[12 of 33 positions shown; findings below may reference images not displayed]

IMPRESSION: Successful injection of  intrathecal contrast for myelography.

MYELOGRAM LUMBAR
FINDINGS: There are small anterior extradural defects at L1-2, L2-3
and L3-4.  No apparent compressive stenosis.

At L4-5, there is severe spinal stenosis.  There is anterolisthesis
of 6 mm.  With standing and bending, this does not change
appreciably.  Very little if any contrast makes it beyond the
stenosis, even with standing and bending.  L5-S1 cannot be well
evaluated due to the absence of contrast.  There may be mild disc
space narrowing perhaps 1 mm of anterolisthesis.
IMPRESSION: Small anterior extradural defects at L1-2, L2-3 and L3-4 without
apparent compressive stenosis.

Very severe spinal stenosis at L4-5, essentially complete block to
the passage of contrast.  Even with standing and bending, no
contrast gets below this area.  There is anterolisthesis of 6 mm.
No motion occurs with flexion and extension.

CT MYELOGRAPHY LUMBAR SPINE
FINDINGS: T12-L1:  Normal.  Conus tip mid L1.

L1-2:  Mild bulging of the disc.  No compressive stenosis.

L2-3:  Mild bulging of the disc.  Annular calcification.
Ligamentous calcification.  No compressive stenosis.

L3-4:  Circumferential bulging of the disc.  Annular calcification.
Mild ligamentous calcification.  Mild narrowing of the lateral
recesses, not grossly compressive.

L4-5:  There is advanced facet arthropathy with anterolisthesis of
5 mm.  The ligaments are markedly hypertrophic.  There is
circumferential protrusion of disc material.  There is severe
spinal stenosis with complete block to the passage of contrast.

L5-S1:  The disc is degenerated.  There is annular calcification
and circumferential annular bulging.  There is facet degeneration
left worse than right.  There is 1 mm of anterolisthesis.  There is
mild narrowing of the subarticular lateral recesses and neural
foramina, not grossly compressive.
IMPRESSION: Severe multifactorial spinal stenosis at L4-5.  Complete block to
the passage of contrast.  Contributing factors include facet
arthropathy with 5 mm anterolisthesis, pronounced ligamentous
hypertrophy, and circumferential protrusion of disc material.

L1-2, L2-3 and L3-4 show bulging of the discs.  There is mild facet
ligamentous hypertrophy.  No apparent compressive stenosis in that
region.  The patient has a tendency towards annular and ligamentous
calcification.

L5-S1:  Facet degeneration and ligamentous hypertrophy worse on the
left.  Annular bulging.  Mild narrowing of the subarticular lateral
recesses and foramina, left more than right, not grossly
compressive.

## 2011-08-25 ENCOUNTER — Encounter: Payer: Self-pay | Admitting: *Deleted

## 2011-08-25 ENCOUNTER — Emergency Department
Admission: EM | Admit: 2011-08-25 | Discharge: 2011-08-25 | Disposition: A | Discharge status: Home / Self Care | Payer: 59 | Source: Home / Self Care | Attending: Emergency Medicine | Admitting: Emergency Medicine

## 2011-08-25 DIAGNOSIS — R059 Cough, unspecified: Secondary | ICD-10-CM

## 2011-08-25 DIAGNOSIS — R05 Cough: Secondary | ICD-10-CM

## 2011-08-25 DIAGNOSIS — J069 Acute upper respiratory infection, unspecified: Secondary | ICD-10-CM

## 2011-08-25 HISTORY — DX: Disorder of thyroid, unspecified: E07.9

## 2011-08-25 HISTORY — DX: Hyperlipidemia, unspecified: E78.5

## 2011-08-25 HISTORY — DX: Gastro-esophageal reflux disease without esophagitis: K21.9

## 2011-08-25 HISTORY — DX: Reserved for inherently not codable concepts without codable children: IMO0001

## 2011-08-25 MED ORDER — CIPROFLOXACIN HCL 500 MG PO TABS: 500.0000 mg | ORAL_TABLET | Freq: Two times a day (BID) | ORAL | Status: AC

## 2011-08-25 MED ORDER — GUAIFENESIN-CODEINE 100-10 MG/5ML PO SYRP: 5.0000 mL | ORAL_SOLUTION | Freq: Four times a day (QID) | ORAL | Status: AC | PRN

## 2011-08-25 NOTE — ED Notes (Signed)
Patient c/o HA, dry cough, low grade fever and sweats x 2 days. He did not receive a flu shot this year.

## 2011-08-25 NOTE — ED Provider Notes (Signed)
History     CSN: [id number]  Arrival date & time 08/25/11  0805   First MD Initiated Contact with Patient 08/25/11 [phone]      Chief Complaint  Patient presents with  . Cough    (Consider location/radiation/quality/duration/timing/severity/associated sxs/prior treatment) HPI Jorge Petersen is a 65 y.o. male who complains of onset of cold symptoms for 2 days. He was given Cipro about 2-3 months ago for similar symptoms and states that it helped a lot. He states that he was tested for flu at the same time. 2 months ago and tested positive at that time. No sore throat + cough No pleuritic pain No wheezing + nasal congestion + post-nasal drainage + sinus pain/pressure ? chest congestion No itchy/red eyes No earache No hemoptysis No SOB +chills/sweats No fever No nausea No vomiting No abdominal pain No diarrhea No skin rashes No fatigue No myalgias +headache    Past Medical History  Diagnosis Date  . Hyperlipidemia   . Thyroid disease   . Reflux     Past Surgical History  Procedure Date  . Thyroidectomy, partial   . Cervical fusion   . Rhinoplasty     No family history on file.  History  Substance Use Topics  . Smoking status: Never Smoker   . Smokeless tobacco: Not on file  . Alcohol Use: No      Review of Systems  Allergies  Aspirin; Morphine; and Penicillins  Home Medications   Current Outpatient Rx  Name Route Sig Dispense Refill  . FEBUXOSTAT 40 MG PO TABS Oral Take 80 mg by mouth daily.      Marland Kitchen LEVOTHYROXINE SODIUM 137 MCG PO TABS Oral Take 137 mcg by mouth daily.      Marland Kitchen PANTOPRAZOLE SODIUM 40 MG PO TBEC Oral Take 40 mg by mouth daily.        BP 125/78  Pulse 78  Temp(Src) 99.1 F (37.3 C) (Oral)  Resp 20  Ht 5\' 11"  (1.803 m)  Wt 215 lb (97.523 kg)  BMI 29.99 kg/m2  SpO2 98%  Physical Exam  Nursing note and vitals reviewed. Constitutional: He is oriented to person, place, and time. He appears well-developed and well-nourished.  HENT:    Head: Normocephalic and atraumatic.  Right Ear: Tympanic membrane, external ear and ear canal normal.  Left Ear: Tympanic membrane, external ear and ear canal normal.  Nose: Mucosal edema and rhinorrhea present.  Mouth/Throat: Posterior oropharyngeal erythema present. No oropharyngeal exudate or posterior oropharyngeal edema.  Eyes: No scleral icterus.  Neck: Neck supple.  Cardiovascular: Regular rhythm and normal heart sounds.   Pulmonary/Chest: Effort normal and breath sounds normal. No respiratory distress.  Neurological: He is alert and oriented to person, place, and time.  Skin: Skin is warm. He is diaphoretic.  Psychiatric: He has a normal mood and affect. His speech is normal.    ED Course  Procedures (including critical care time)  Labs Reviewed - No data to display No results found.   No diagnosis found.    MDM  1)  Take the prescribed antibiotic as instructed. He asked specifically for Cipro. Since that worked better for him. This may be just a cold versus flu versus bronchitis. I do not hear any evidence of pneumonia. He states that he already Test positive for the flu however, this could be a different strain. 2)  Use nasal saline solution (over the counter) at least 3 times a day. 3)  Use over the counter decongestants like Zyrtec-D every  12 hours as needed to help with congestion.  If you have hypertension, do not take medicines with sudafed.  4)  Can take tylenol every 6 hours or motrin every 8 hours for pain or fever. 5)  Follow up with your primary doctor if no improvement in 5-7 days, sooner if increasing pain, fever, or new symptoms.       Lily Kocher, MD 08/25/11 [phone]

## 2011-10-06 ENCOUNTER — Encounter: Payer: Self-pay | Admitting: *Deleted

## 2011-10-06 ENCOUNTER — Emergency Department
Admission: EM | Admit: 2011-10-06 | Discharge: 2011-10-06 | Disposition: A | Discharge status: Home / Self Care | Payer: 59 | Source: Home / Self Care | Attending: Family Medicine | Admitting: Family Medicine

## 2011-10-06 DIAGNOSIS — J069 Acute upper respiratory infection, unspecified: Secondary | ICD-10-CM

## 2011-10-06 MED ORDER — HYDROCOD POLST-CHLORPHEN POLST 10-8 MG/5ML PO LQCR: 5.0000 mL | Freq: Every evening | ORAL | Status: DC | PRN

## 2011-10-06 MED ORDER — CIPROFLOXACIN HCL 500 MG PO TABS: 500.0000 mg | ORAL_TABLET | Freq: Two times a day (BID) | ORAL | Status: AC

## 2011-10-06 NOTE — ED Provider Notes (Signed)
History     CSN: [id number]  Arrival date & time 10/06/11  1626   First MD Initiated Contact with Patient 10/06/11 1717      Chief Complaint  Patient presents with  . Nasal Congestion     HPI Comments: Patient complains of approximately 5 day history of gradually progressive URI symptoms beginning with a mild sore throat (now improved), followed by progressive nasal congestion (also improved).  A cough started about 4 days ago.  Complains of fatigue and initial myalgias.  Cough is now worse at night and generally non-productive during the day.  There has been no pleuritic pain or shortness of breath, but he does wheeze.  He started taking a course of Cipro 500mg  bid that he already has.  He reports that he also has a supply of prednisone 10mg  at home.  No fevers, chills, and sweats    The history is provided by the patient.    Past Medical History  Diagnosis Date  . Hyperlipidemia   . Thyroid disease   . Reflux     Past Surgical History  Procedure Date  . Thyroidectomy, partial   . Cervical fusion   . Rhinoplasty     History reviewed. No pertinent family history.  History  Substance Use Topics  . Smoking status: Never Smoker   . Smokeless tobacco: Not on file  . Alcohol Use: No      Review of Systems + sore throat + cough No pleuritic pain + wheezing Minimal nasal congestion ? post-nasal drainage No sinus pain/pressure No itchy/red eyes No earache No hemoptysis No SOB No fever/chills No nausea No vomiting No abdominal pain No diarrhea No urinary symptoms No skin rashes + fatigue + myalgias initially No headache   Allergies  Aspirin; Morphine; and Penicillins  Home Medications   Current Outpatient Rx  Name Route Sig Dispense Refill  . HYDROCOD POLST-CPM POLST ER 10-8 MG/5ML PO LQCR Oral Take 5 mLs by mouth at bedtime as needed. 115 mL 0  . CIPROFLOXACIN HCL 500 MG PO TABS Oral Take 1 tablet (500 mg total) by mouth 2 (two) times daily. 10 tablet  0  . FEBUXOSTAT 40 MG PO TABS Oral Take 80 mg by mouth daily.      Marland Kitchen LEVOTHYROXINE SODIUM 137 MCG PO TABS Oral Take 137 mcg by mouth daily.      Marland Kitchen PANTOPRAZOLE SODIUM 40 MG PO TBEC Oral Take 40 mg by mouth daily.        BP 112/83  Pulse 79  Temp(Src) 98.7 F (37.1 C) (Oral)  Resp 16  Wt 215 lb (97.523 kg)  SpO2 97%  Physical Exam Nursing notes and Vital Signs reviewed. Appearance:  Patient appears healthy, stated age, and in no acute distress Eyes:  Pupils are equal, round, and reactive to light and accomodation.  Extraocular movement is intact.  Conjunctivae are not inflamed  Ears:  Canals normal.  Tympanic membranes normal.  Nose:  Mildly congested turbinates.  No sinus tenderness.   Pharynx:  Normal Neck:  Supple.  No adenopathy Lungs:  Faint bibasilar amphoric sounds.  Breath sounds are equal.  Heart:  Regular rate and rhythm without murmurs, rubs, or gallops.  Abdomen:  Nontender without masses or hepatosplenomegaly.  Bowel sounds are present.  No CVA or flank tenderness.  Extremities:  No edema.  No calf tenderness Skin:  No rash present.   ED Course  Procedures  none      1. Acute upper respiratory infections of  unspecified site       MDM  Continue Cipro 500mg  bid for total one week.  Rx for Tussionex at bedtime.  Stop Tessalon during day. Take plain Mucinex (guaifenesin) twice daily for cough and congestion.  Increase fluid intake, rest. For sinus congestion may use Afrin nasal spray (or generic oxymetazoline) twice daily for about 5 days.  Also recommend using saline nasal spray several times daily and saline nasal irrigation (AYR is a common brand) Stop all antihistamines for now, and other non-prescription cough/cold preparations. Take prednisone 10mg  as follows:  Take 2 tabs today, then 2 twice daily for two days, then one tab twice daily for 2 days, then 1 daily for two days.  Take with food.  Followup with PCP if not improving.           Donna Christen, MD 10/06/11 1755

## 2011-10-06 NOTE — ED Notes (Signed)
Patient c/o cough and congestion x 5 days. He has taken 3 days of Cipro from a leftover Rx. He has also used benzonatate.

## 2012-03-01 ENCOUNTER — Encounter: Payer: Self-pay | Admitting: *Deleted

## 2012-03-01 ENCOUNTER — Emergency Department (INDEPENDENT_AMBULATORY_CARE_PROVIDER_SITE_OTHER): Payer: 59

## 2012-03-01 ENCOUNTER — Emergency Department
Admission: EM | Admit: 2012-03-01 | Discharge: 2012-03-01 | Disposition: A | Discharge status: Home / Self Care | Payer: 59 | Source: Home / Self Care

## 2012-03-01 DIAGNOSIS — X500XXA Overexertion from strenuous movement or load, initial encounter: Secondary | ICD-10-CM

## 2012-03-01 DIAGNOSIS — M25579 Pain in unspecified ankle and joints of unspecified foot: Secondary | ICD-10-CM

## 2012-03-01 DIAGNOSIS — M7989 Other specified soft tissue disorders: Secondary | ICD-10-CM

## 2012-03-01 DIAGNOSIS — S93409A Sprain of unspecified ligament of unspecified ankle, initial encounter: Secondary | ICD-10-CM

## 2012-03-01 HISTORY — DX: Gout, unspecified: M10.9

## 2012-03-01 MED ORDER — PREDNISONE 50 MG PO TABS: ORAL_TABLET | ORAL | Status: AC

## 2012-03-01 NOTE — ED Notes (Signed)
Pt c/o LT  Foot/ankle pain x 1 wk, post fall. He has a hx of gout. He states that the pain is worse x 3 days with increased swelling. He has taken prednisone 10 mg x 4 tablets.

## 2012-03-01 NOTE — ED Provider Notes (Signed)
History     CSN: [id number]  Arrival date & time 03/01/12  1409   None     Chief Complaint  Patient presents with  . Foot Pain    HPI Comments: Pt rolled L ankle 1 week ago while walking.  Rolled ankle medially.  Pt with prior history of gout. Currently on uloric.  Most recent uric acid level 4.6 w/in last 3 months per pt.  Gout usually in great toes bilaterally.  No significant great toe pain currently.  Pain predominantly on lateral aspect of L ankle with radiation of pain toward L 5th metatarsal.  No numbness or paresthesias.  Has been able to minimally bear weight on ankle and foot.  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  Has used 20mg  with minimal improvement in pain.  Also has used 1 colchicine tablet with no change in sxs.       Patient is a 66 y.o. male presenting with lower extremity pain. The history is provided by the patient.  Foot Pain This is a new problem. The current episode started more than 1 week ago. The problem occurs constantly. The symptoms are aggravated by walking. The symptoms are relieved by rest. Treatments tried: low dose prednisone.  Prior hx/o recurrent ankle trauma bilaterally in the past.   Past Medical History  Diagnosis Date  . Hyperlipidemia   . Thyroid disease   . Reflux   . Gout     Past Surgical History  Procedure Date  . Thyroidectomy, partial   . Cervical fusion   . Rhinoplasty     History reviewed. No pertinent family history.  History  Substance Use Topics  . Smoking status: Never Smoker   . Smokeless tobacco: Not on file  . Alcohol Use: No      Review of Systems  All other systems reviewed and are negative.    Allergies  Aspirin; Morphine; and Penicillins  Home Medications   Current Outpatient Rx  Name Route Sig Dispense Refill  . ATORVASTATIN CALCIUM 10 MG PO TABS Oral Take 10 mg by mouth daily.    Marland Kitchen HYDROCOD POLST-CPM POLST ER 10-8 MG/5ML PO LQCR Oral Take 5 mLs by mouth at bedtime as  needed. 115 mL 0  . FEBUXOSTAT 40 MG PO TABS Oral Take 80 mg by mouth daily.      Marland Kitchen LEVOTHYROXINE SODIUM 137 MCG PO TABS Oral Take 137 mcg by mouth daily.      Marland Kitchen PANTOPRAZOLE SODIUM 40 MG PO TBEC Oral Take 40 mg by mouth daily.        BP 147/92  Pulse 95  Temp 98.5 F (36.9 C) (Oral)  Resp 18  Ht 5\' 11"  (1.803 m)  Wt 213 lb 12 oz (96.956 kg)  BMI 29.81 kg/m2  SpO2 98%  Physical Exam  Constitutional: He appears well-developed and well-nourished.  HENT:  Head: Normocephalic and atraumatic.  Eyes: Conjunctivae are normal. Pupils are equal, round, and reactive to light.  Neck: Normal range of motion. Neck supple.  Cardiovascular: Normal rate and regular rhythm.   Pulmonary/Chest: Effort normal.  Abdominal: Soft.  Ankle: + lateral ankle and lateral malleolar swelling and TTP Range of motion decreased in all planes.  Strength is 5/5 in all directions. Stable lateral and medial ligaments; squeeze test and kleiger test unremarkable; Talar dome nontender; + TTP over 5th metatarsal ray.  No tenderness over N spot or navicular prominence No sign of peroneal tendon subluxations; Negative tarsal tunnel tinel's Able to walk  4 steps.   ED Course  Procedures (including critical care time)  Labs Reviewed - No data to display No results found.   No diagnosis found.  Dg Ankle Complete Left  03/01/2012  *RADIOLOGY REPORT*  Clinical Data: Twisted ankle with pain and swelling  LEFT ANKLE COMPLETE - 3+ VIEW  Comparison: None.  Findings: Bony densities are noted just beneath the medial and lateral malleoli.  These densities appear well corticated and therefore are most consistent with prior trauma and old avulsion fractures.  No acute fracture is seen.  The tibiotalar articulation and alignment appear normal.  IMPRESSION: No acute fracture.  Post-traumatic changes of the medial and lateral malleoli.  Original Report Authenticated By: Juline Patch, M.D.   Dg Foot Complete Left  03/01/2012   *RADIOLOGY REPORT*  Clinical Data: Twisted ankle with pain and swelling laterally  LEFT FOOT - COMPLETE 3+ VIEW  Comparison: None.  Findings: There is some degenerative change in the midfoot.  There is also degenerative change at the left first MTP joint with loss of joint space and sclerosis with spurring.  No acute fracture is seen.  Alignment is normal. Arterial calcifications are noted.  IMPRESSION: Degenerative change.  No acute fracture.  Original Report Authenticated By: Juline Patch, M.D.     MDM  Likely mid-high grade ankle sprain.  DDx includes atypical gout flare Xrays negative for fracture.  Will treat with prednisone.  Ankle brace.  Sports medicine follow up in 1-2 weeks.  Discussed general [facility] red flags.  Handout given.  Follow up as needed.     The patient and/or caregiver has been counseled thoroughly with regard to treatment plan and/or medications prescribed including dosage, schedule, interactions, rationale for use, and possible side effects and they verbalize understanding. Diagnoses and expected course of recovery discussed and will return if not improved as expected or if the condition worsens. Patient and/or caregiver verbalized understanding.             Floydene Flock, MD 03/01/12 1520

## 2012-03-02 NOTE — ED Provider Notes (Signed)
Agree with exam, assessment, and plan.   Lattie Haw, MD 03/02/12 [phone]

## 2012-06-19 ENCOUNTER — Encounter: Payer: Self-pay | Admitting: Emergency Medicine

## 2012-06-19 ENCOUNTER — Emergency Department (INDEPENDENT_AMBULATORY_CARE_PROVIDER_SITE_OTHER): Payer: 59

## 2012-06-19 ENCOUNTER — Emergency Department
Admission: EM | Admit: 2012-06-19 | Discharge: 2012-06-19 | Disposition: A | Discharge status: Home / Self Care | Payer: 59 | Source: Home / Self Care | Attending: Family Medicine | Admitting: Family Medicine

## 2012-06-19 DIAGNOSIS — M25572 Pain in left ankle and joints of left foot: Secondary | ICD-10-CM

## 2012-06-19 DIAGNOSIS — M25579 Pain in unspecified ankle and joints of unspecified foot: Secondary | ICD-10-CM

## 2012-06-19 MED ORDER — METHYLPREDNISOLONE SODIUM SUCC 125 MG IJ SOLR
125.0000 mg | Freq: Once | INTRAMUSCULAR | Status: AC
  Administered 2012-06-19: 125 mg via INTRAMUSCULAR

## 2012-06-19 MED ORDER — PREDNISONE 20 MG PO TABS: ORAL_TABLET | ORAL | Status: DC

## 2012-06-19 NOTE — ED Provider Notes (Signed)
History     CSN: [id number]  Arrival date & time 06/19/12  1523   First MD Initiated Contact with Patient 06/19/12 1537      Chief Complaint  Patient presents with  . Ankle Pain     HPI Comments: Patient has a 3 day history of increased left ankle pain.  He recalls no recent injury or change in physical activities.  He has pain with weight bearing.  He has a history of gout, and CPPD of the left wrist.  Patient is a 66 y.o. male presenting with ankle pain. The history is provided by the patient.  Ankle Pain This is a recurrent problem. Episode onset: 3 days ago. The problem occurs constantly. The problem has not changed since onset.Associated symptoms comments: none. The symptoms are aggravated by walking and standing. Nothing relieves the symptoms. Treatments tried: Hydrocodone. The treatment provided mild relief.    Past Medical History  Diagnosis Date  . Hyperlipidemia   . Thyroid disease   . Reflux   . Gout     Past Surgical History  Procedure Date  . Thyroidectomy, partial   . Cervical fusion   . Rhinoplasty     Family History  Problem Relation Age of Onset  . Heart failure Mother     History  Substance Use Topics  . Smoking status: Never Smoker   . Smokeless tobacco: Not on file  . Alcohol Use: No      Review of Systems  All other systems reviewed and are negative.    Allergies  Aspirin; Morphine; and Penicillins  Home Medications   Current Outpatient Rx  Name Route Sig Dispense Refill  . ATORVASTATIN CALCIUM 10 MG PO TABS Oral Take 10 mg by mouth daily.    Marland Kitchen HYDROCOD POLST-CPM POLST ER 10-8 MG/5ML PO LQCR Oral Take 5 mLs by mouth at bedtime as needed. 115 mL 0  . FEBUXOSTAT 40 MG PO TABS Oral Take 80 mg by mouth daily.      Marland Kitchen LEVOTHYROXINE SODIUM 137 MCG PO TABS Oral Take 137 mcg by mouth daily.      Marland Kitchen PANTOPRAZOLE SODIUM 40 MG PO TBEC Oral Take 40 mg by mouth daily.      Marland Kitchen PREDNISONE 20 MG PO TABS  Take one tab by mouth twice daily for 4  days.  Begin 06/20/12 8 tablet 0    BP 133/84  Pulse 95  Temp 98.9 F (37.2 C) (Oral)  Resp 18  Ht 5\' 11"  (1.803 m)  Wt 212 lb (96.163 kg)  BMI 29.57 kg/m2  SpO2 98%  Physical Exam Nursing notes and Vital Signs reviewed. Appearance:  Patient appears stated age, and in no acute distress Eyes:  Pupils are equal, round, and reactive to light and accomodation.  Extraocular movement is intact.  Conjunctivae are not inflamed  Pharynx:  Normal Neck:  Supple.  No adenopathy Lungs:  Clear to auscultation.  Breath sounds are equal.  Heart:  Regular rate and rhythm without murmurs, rubs, or gallops.  Abdomen:  Nontender    Extremities:  Left ankle/foot mildly swollen, with tenderness over the medial and lateral malleoli.  There is mild warmth but no erythema.  Pedal pulses intact. Skin:  No rash present.   ED Course  Procedures none   *RADIOLOGY REPORT*  Clinical Data: Left ankle pain for 3 days  LEFT ANKLE - 2 VIEW  Comparison: Left ankle films of 03/01/2012  Findings: The ankle mortise intact. The talar dome is normal. The  there is chronic fragmentation inferior to the medial and lateral malleolus. Calcaneus is normal. Extensive vascular calcifications noted.  IMPRESSION: 1. No evidence of acute fracture of the left ankle. 2. Remote avulsion fractures and degenerative change at the tips of the malleoli are unchanged from prior.   Original Report Authenticated By: Genevive Bi, M.D.     1. Left ankle pain; suspect recurrent gout       MDM  Solumedrol 125mg  IM.  Begin prednisone burst tomorrow. Elevate leg.  Drink plenty of fluids.  May take pain medication as prescribed. Followup with Family Doctor if not improved in 5 days.        Lattie Haw, MD 06/22/12 1200

## 2012-06-19 NOTE — ED Notes (Signed)
Intermittent left ankle pain for 'long time'; now some edema and severe pain upon weight bearing; no known injury. Has taken Hydrocodone today for pain since cannot take NSAIDS due to kidney issues.

## 2013-07-25 HISTORY — PX: EYE SURGERY: SHX253

## 2013-09-25 HISTORY — PX: EYE SURGERY: SHX253

## 2016-07-25 HISTORY — PX: JOINT REPLACEMENT: SHX530

## 2017-06-25 HISTORY — PX: KIDNEY SURGERY: SHX687

## 2017-06-25 HISTORY — PX: HERNIA REPAIR: SHX51

## 2018-08-26 ENCOUNTER — Encounter (HOSPITAL_COMMUNITY): Payer: Self-pay | Admitting: *Deleted

## 2018-09-08 NOTE — Patient Instructions (Addendum)
Jorge Petersen  09/08/2018   Your procedure is scheduled on: 09-16-18    Report to [facility] Main  Entrance    Report to Admitting at 5:30 AM    Call this number if you have problems the morning of surgery [phone]    Remember: Do not eat food or drink liquids :After Midnight.      Take these medicines the morning of surgery with A SIP OF WATER: Ranitidine (Zantac), and Febuxostat (Uloric)    BRUSH YOUR TEETH MORNING OF SURGERY AND RINSE YOUR MOUTH OUT, NO CHEWING GUM CANDY OR MINTS.                               You may not have any metal on your body including hair pins and              piercings  Do not wear jewelry, cologne,  lotions, powders or deodorant             Men may shave face and neck.   Do not bring valuables to the hospital. [facility] IS NOT             RESPONSIBLE   FOR VALUABLES.  Contacts, dentures or bridgework may not be worn into surgery.  Leave suitcase in the car. After surgery it may be brought to your room.     Patients discharged the day of surgery will not be allowed to drive home. IF YOU ARE HAVING SURGERY AND GOING HOME THE SAME DAY, YOU MUST HAVE AN ADULT TO DRIVE YOU HOME AND BE WITH YOU FOR 24 HOURS. YOU MAY GO HOME BY TAXI OR UBER OR ORTHERWISE, BUT AN ADULT MUST ACCOMPANY YOU HOME AND STAY WITH YOU FOR 24 HOURS.    Special Instructions: N/A              Please read over the following fact sheets you were given: _____________________________________________________________________             [facility] - Preparing for Surgery Before surgery, you can play an important role.  Because skin is not sterile, your skin needs to be as free of germs as possible.  You can reduce the number of germs on your skin by washing with CHG (chlorahexidine gluconate) soap before surgery.  CHG is an antiseptic cleaner which kills germs and bonds with the skin to continue killing germs even after washing. Please DO NOT use if you  have an allergy to CHG or antibacterial soaps.  If your skin becomes reddened/irritated stop using the CHG and inform your nurse when you arrive at Short Stay. Do not shave (including legs and underarms) for at least 48 hours prior to the first CHG shower.  You may shave your face/neck. Please follow these instructions carefully:  1.  Shower with CHG Soap the night before surgery and the  morning of Surgery.  2.  If you choose to wash your hair, wash your hair first as usual with your  normal  shampoo.  3.  After you shampoo, rinse your hair and body thoroughly to remove the  shampoo.                           4.  Use CHG as you would any other liquid soap.  You  can apply chg directly  to the skin and wash                       Gently with a scrungie or clean washcloth.  5.  Apply the CHG Soap to your body ONLY FROM THE NECK DOWN.   Do not use on face/ open                           Wound or open sores. Avoid contact with eyes, ears mouth and genitals (private parts).                       Wash face,  Genitals (private parts) with your normal soap.             6.  Wash thoroughly, paying special attention to the area where your surgery  will be performed.  7.  Thoroughly rinse your body with warm water from the neck down.  8.  DO NOT shower/wash with your normal soap after using and rinsing off  the CHG Soap.                9.  Pat yourself dry with a clean towel.            10.  Wear clean pajamas.            11.  Place clean sheets on your bed the night of your first shower and do not  sleep with pets. Day of Surgery : Do not apply any lotions/deodorants the morning of surgery.  Please wear clean clothes to the hospital/surgery center.  FAILURE TO FOLLOW THESE INSTRUCTIONS MAY RESULT IN THE CANCELLATION OF YOUR SURGERY PATIENT SIGNATURE_________________________________  NURSE  SIGNATURE__________________________________  ________________________________________________________________________

## 2018-09-09 ENCOUNTER — Encounter (HOSPITAL_COMMUNITY)
Admission: RE | Admit: 2018-09-09 | Discharge: 2018-09-09 | Disposition: A | Discharge status: Home / Self Care | Payer: No Typology Code available for payment source | Source: Ambulatory Visit | Attending: Orthopedic Surgery | Admitting: Orthopedic Surgery

## 2018-09-09 ENCOUNTER — Other Ambulatory Visit: Payer: Self-pay

## 2018-09-09 ENCOUNTER — Encounter (HOSPITAL_COMMUNITY): Payer: Self-pay

## 2018-09-09 DIAGNOSIS — Z01812 Encounter for preprocedural laboratory examination: Secondary | ICD-10-CM | POA: Diagnosis not present

## 2018-09-09 DIAGNOSIS — M75102 Unspecified rotator cuff tear or rupture of left shoulder, not specified as traumatic: Secondary | ICD-10-CM | POA: Insufficient documentation

## 2018-09-09 LAB — BASIC METABOLIC PANEL WITH GFR
Anion gap: 12 (ref 5–15)
BUN: 23 mg/dL (ref 8–23)
CO2: 25 mmol/L (ref 22–32)
Calcium: 8.8 mg/dL — ABNORMAL LOW (ref 8.9–10.3)
Chloride: 107 mmol/L (ref 98–111)
Creatinine, Ser: 1.47 mg/dL — ABNORMAL HIGH (ref 0.61–1.24)
GFR calc Af Amer: 55 mL/min — ABNORMAL LOW
GFR calc non Af Amer: 48 mL/min — ABNORMAL LOW
Glucose, Bld: 103 mg/dL — ABNORMAL HIGH (ref 70–99)
Potassium: 3.5 mmol/L (ref 3.5–5.1)
Sodium: 144 mmol/L (ref 135–145)

## 2018-09-09 LAB — CBC
HCT: 45.9 % (ref 39.0–52.0)
Hemoglobin: 14.8 g/dL (ref 13.0–17.0)
MCH: 29 pg (ref 26.0–34.0)
MCHC: 32.2 g/dL (ref 30.0–36.0)
MCV: 90 fL (ref 80.0–100.0)
Platelets: 271 K/uL (ref 150–400)
RBC: 5.1 MIL/uL (ref 4.22–5.81)
RDW: 12.5 % (ref 11.5–15.5)
WBC: 7.9 K/uL (ref 4.0–10.5)
nRBC: 0 % (ref 0.0–0.2)

## 2018-09-09 LAB — SURGICAL PCR SCREEN
MRSA, PCR: NEGATIVE
Staphylococcus aureus: NEGATIVE

## 2018-09-16 ENCOUNTER — Encounter (HOSPITAL_COMMUNITY)
Admission: AD | Disposition: A | Discharge status: Home / Self Care | Payer: Self-pay | Source: Home / Self Care | Attending: Orthopedic Surgery

## 2018-09-16 ENCOUNTER — Ambulatory Visit (HOSPITAL_COMMUNITY): Payer: No Typology Code available for payment source | Admitting: Physician Assistant

## 2018-09-16 ENCOUNTER — Encounter (HOSPITAL_COMMUNITY): Payer: Self-pay | Admitting: Emergency Medicine

## 2018-09-16 ENCOUNTER — Other Ambulatory Visit: Payer: Self-pay

## 2018-09-16 ENCOUNTER — Inpatient Hospital Stay (HOSPITAL_COMMUNITY)
Admission: AD | Admit: 2018-09-16 | Discharge: 2018-09-17 | DRG: 483 | Disposition: A | Discharge status: Home / Self Care | Payer: No Typology Code available for payment source | Attending: Orthopedic Surgery | Admitting: Orthopedic Surgery

## 2018-09-16 ENCOUNTER — Ambulatory Visit (HOSPITAL_COMMUNITY): Payer: No Typology Code available for payment source | Admitting: Anesthesiology

## 2018-09-16 DIAGNOSIS — Z880 Allergy status to penicillin: Secondary | ICD-10-CM | POA: Diagnosis not present

## 2018-09-16 DIAGNOSIS — Z961 Presence of intraocular lens: Secondary | ICD-10-CM | POA: Diagnosis present

## 2018-09-16 DIAGNOSIS — E669 Obesity, unspecified: Secondary | ICD-10-CM | POA: Diagnosis not present

## 2018-09-16 DIAGNOSIS — Z8249 Family history of ischemic heart disease and other diseases of the circulatory system: Secondary | ICD-10-CM

## 2018-09-16 DIAGNOSIS — Z905 Acquired absence of kidney: Secondary | ICD-10-CM

## 2018-09-16 DIAGNOSIS — M75102 Unspecified rotator cuff tear or rupture of left shoulder, not specified as traumatic: Principal | ICD-10-CM | POA: Diagnosis present

## 2018-09-16 DIAGNOSIS — Z888 Allergy status to other drugs, medicaments and biological substances status: Secondary | ICD-10-CM

## 2018-09-16 DIAGNOSIS — Z96612 Presence of left artificial shoulder joint: Secondary | ICD-10-CM

## 2018-09-16 DIAGNOSIS — M109 Gout, unspecified: Secondary | ICD-10-CM | POA: Diagnosis not present

## 2018-09-16 DIAGNOSIS — E039 Hypothyroidism, unspecified: Secondary | ICD-10-CM | POA: Diagnosis present

## 2018-09-16 DIAGNOSIS — E785 Hyperlipidemia, unspecified: Secondary | ICD-10-CM | POA: Diagnosis present

## 2018-09-16 DIAGNOSIS — Z9842 Cataract extraction status, left eye: Secondary | ICD-10-CM

## 2018-09-16 DIAGNOSIS — Z79899 Other long term (current) drug therapy: Secondary | ICD-10-CM

## 2018-09-16 DIAGNOSIS — Z9841 Cataract extraction status, right eye: Secondary | ICD-10-CM | POA: Diagnosis not present

## 2018-09-16 DIAGNOSIS — Z6831 Body mass index (BMI) 31.0-31.9, adult: Secondary | ICD-10-CM | POA: Diagnosis not present

## 2018-09-16 DIAGNOSIS — Z96652 Presence of left artificial knee joint: Secondary | ICD-10-CM | POA: Diagnosis present

## 2018-09-16 DIAGNOSIS — Z981 Arthrodesis status: Secondary | ICD-10-CM

## 2018-09-16 DIAGNOSIS — Z885 Allergy status to narcotic agent status: Secondary | ICD-10-CM

## 2018-09-16 DIAGNOSIS — Z7982 Long term (current) use of aspirin: Secondary | ICD-10-CM | POA: Diagnosis not present

## 2018-09-16 DIAGNOSIS — K219 Gastro-esophageal reflux disease without esophagitis: Secondary | ICD-10-CM | POA: Diagnosis not present

## 2018-09-16 HISTORY — PX: REVERSE SHOULDER ARTHROPLASTY: SHX5054

## 2018-09-16 SURGERY — ARTHROPLASTY, SHOULDER, TOTAL, REVERSE
Anesthesia: General | Site: Shoulder | Laterality: Left

## 2018-09-16 MED ORDER — LACTATED RINGERS IV SOLN
INTRAVENOUS | Status: DC
  Administered 2018-09-16: 07:00:00 via INTRAVENOUS

## 2018-09-16 MED ORDER — DEXAMETHASONE SODIUM PHOSPHATE 10 MG/ML IJ SOLN
INTRAMUSCULAR | Status: DC | PRN
  Administered 2018-09-16: 10 mg via INTRAVENOUS

## 2018-09-16 MED ORDER — EPHEDRINE SULFATE-NACL 50-0.9 MG/10ML-% IV SOSY
PREFILLED_SYRINGE | INTRAVENOUS | Status: DC | PRN
  Administered 2018-09-16: 10 mg via INTRAVENOUS

## 2018-09-16 MED ORDER — FENTANYL CITRATE (PF) 100 MCG/2ML IJ SOLN
INTRAMUSCULAR | Status: AC
  Filled 2018-09-16: qty 2

## 2018-09-16 MED ORDER — ONDANSETRON HCL 4 MG/2ML IJ SOLN: 4.0000 mg | Freq: Once | INTRAMUSCULAR | Status: DC | PRN

## 2018-09-16 MED ORDER — DIPHENHYDRAMINE HCL 12.5 MG/5ML PO ELIX: 12.5000 mg | ORAL_SOLUTION | ORAL | Status: DC | PRN

## 2018-09-16 MED ORDER — BUPIVACAINE LIPOSOME 1.3 % IJ SUSP
INTRAMUSCULAR | Status: DC | PRN
  Administered 2018-09-16: 10 mL via PERINEURAL

## 2018-09-16 MED ORDER — SUGAMMADEX SODIUM 200 MG/2ML IV SOLN
INTRAVENOUS | Status: DC | PRN
  Administered 2018-09-16: 200 mg via INTRAVENOUS

## 2018-09-16 MED ORDER — ACETAMINOPHEN 500 MG PO TABS
1000.0000 mg | ORAL_TABLET | Freq: Once | ORAL | Status: AC
  Administered 2018-09-16: 1000 mg via ORAL
  Filled 2018-09-16: qty 2

## 2018-09-16 MED ORDER — METOCLOPRAMIDE HCL 5 MG/ML IJ SOLN: 5.0000 mg | Freq: Three times a day (TID) | INTRAMUSCULAR | Status: DC | PRN

## 2018-09-16 MED ORDER — ROCURONIUM BROMIDE 100 MG/10ML IV SOLN
INTRAVENOUS | Status: AC
  Filled 2018-09-16: qty 1

## 2018-09-16 MED ORDER — GABAPENTIN 300 MG PO CAPS
300.0000 mg | ORAL_CAPSULE | Freq: Once | ORAL | Status: AC
  Administered 2018-09-16: 300 mg via ORAL
  Filled 2018-09-16: qty 1

## 2018-09-16 MED ORDER — CHLORHEXIDINE GLUCONATE 4 % EX LIQD: 60.0000 mL | Freq: Once | CUTANEOUS | Status: DC

## 2018-09-16 MED ORDER — TRANEXAMIC ACID-NACL 1000-0.7 MG/100ML-% IV SOLN
1000.0000 mg | INTRAVENOUS | Status: AC
  Administered 2018-09-16: 1000 mg via INTRAVENOUS
  Filled 2018-09-16: qty 100

## 2018-09-16 MED ORDER — PANTOPRAZOLE SODIUM 40 MG PO TBEC
40.0000 mg | DELAYED_RELEASE_TABLET | Freq: Every day | ORAL | Status: DC
  Administered 2018-09-16 – 2018-09-17 (×2): 40 mg via ORAL
  Filled 2018-09-16 (×2): qty 1

## 2018-09-16 MED ORDER — PROPOFOL 10 MG/ML IV BOLUS
INTRAVENOUS | Status: AC
  Filled 2018-09-16: qty 40

## 2018-09-16 MED ORDER — FENTANYL CITRATE (PF) 100 MCG/2ML IJ SOLN
25.0000 ug | INTRAMUSCULAR | Status: DC | PRN
  Administered 2018-09-16 (×4): 25 ug via INTRAVENOUS

## 2018-09-16 MED ORDER — LACTATED RINGERS IV SOLN: INTRAVENOUS | Status: DC

## 2018-09-16 MED ORDER — ASPIRIN EC 81 MG PO TBEC
81.0000 mg | DELAYED_RELEASE_TABLET | Freq: Every day | ORAL | Status: DC
  Administered 2018-09-16 – 2018-09-17 (×2): 81 mg via ORAL
  Filled 2018-09-16 (×2): qty 1

## 2018-09-16 MED ORDER — OXYCODONE HCL 5 MG PO TABS
5.0000 mg | ORAL_TABLET | ORAL | Status: DC | PRN
  Administered 2018-09-17 (×2): 5 mg via ORAL
  Filled 2018-09-16 (×2): qty 1

## 2018-09-16 MED ORDER — METHOCARBAMOL 500 MG IVPB - SIMPLE MED
INTRAVENOUS | Status: AC
  Administered 2018-09-16: 500 mg via INTRAVENOUS
  Filled 2018-09-16: qty 50

## 2018-09-16 MED ORDER — HYDROMORPHONE HCL 1 MG/ML IJ SOLN: 0.5000 mg | INTRAMUSCULAR | Status: DC | PRN

## 2018-09-16 MED ORDER — CEFAZOLIN SODIUM-DEXTROSE 2-4 GM/100ML-% IV SOLN
2.0000 g | INTRAVENOUS | Status: AC
  Administered 2018-09-16: 2 g via INTRAVENOUS
  Filled 2018-09-16: qty 100

## 2018-09-16 MED ORDER — ONDANSETRON HCL 4 MG PO TABS: 4.0000 mg | ORAL_TABLET | Freq: Four times a day (QID) | ORAL | Status: DC | PRN

## 2018-09-16 MED ORDER — ROCURONIUM BROMIDE 10 MG/ML (PF) SYRINGE
PREFILLED_SYRINGE | INTRAVENOUS | Status: DC | PRN
  Administered 2018-09-16: 60 mg via INTRAVENOUS

## 2018-09-16 MED ORDER — METHOCARBAMOL 1000 MG/10ML IJ SOLN
500.0000 mg | Freq: Four times a day (QID) | INTRAVENOUS | Status: DC | PRN
  Filled 2018-09-16: qty 5

## 2018-09-16 MED ORDER — FLUOROMETHOLONE ACETATE 0.1 % OP SUSP
1.0000 [drp] | Freq: Two times a day (BID) | OPHTHALMIC | Status: DC
  Administered 2018-09-16 – 2018-09-17 (×2): 1 [drp] via OPHTHALMIC
  Filled 2018-09-16: qty 1

## 2018-09-16 MED ORDER — ACETAMINOPHEN 10 MG/ML IV SOLN: 1000.0000 mg | Freq: Once | INTRAVENOUS | Status: DC | PRN

## 2018-09-16 MED ORDER — 0.9 % SODIUM CHLORIDE (POUR BTL) OPTIME
TOPICAL | Status: DC | PRN
  Administered 2018-09-16: 1000 mL

## 2018-09-16 MED ORDER — BISACODYL 5 MG PO TBEC: 5.0000 mg | DELAYED_RELEASE_TABLET | Freq: Every day | ORAL | Status: DC | PRN

## 2018-09-16 MED ORDER — PROPOFOL 10 MG/ML IV BOLUS
INTRAVENOUS | Status: DC | PRN
  Administered 2018-09-16: 20 mg via INTRAVENOUS
  Administered 2018-09-16: 180 mg via INTRAVENOUS

## 2018-09-16 MED ORDER — MIDAZOLAM HCL 5 MG/5ML IJ SOLN
INTRAMUSCULAR | Status: DC | PRN
  Administered 2018-09-16 (×2): 1 mg via INTRAVENOUS

## 2018-09-16 MED ORDER — PHENYLEPHRINE HCL 10 MG/ML IJ SOLN
INTRAMUSCULAR | Status: AC
  Filled 2018-09-16: qty 1

## 2018-09-16 MED ORDER — FENTANYL CITRATE (PF) 100 MCG/2ML IJ SOLN
INTRAMUSCULAR | Status: AC
  Administered 2018-09-16: 25 ug via INTRAVENOUS
  Filled 2018-09-16: qty 2

## 2018-09-16 MED ORDER — DEXAMETHASONE SODIUM PHOSPHATE 10 MG/ML IJ SOLN
INTRAMUSCULAR | Status: AC
  Filled 2018-09-16: qty 3

## 2018-09-16 MED ORDER — MENTHOL 3 MG MT LOZG: 1.0000 | LOZENGE | OROMUCOSAL | Status: DC | PRN

## 2018-09-16 MED ORDER — KETAMINE HCL 10 MG/ML IJ SOLN
INTRAMUSCULAR | Status: DC | PRN
  Administered 2018-09-16 (×2): 25 mg via INTRAVENOUS

## 2018-09-16 MED ORDER — TEMAZEPAM 15 MG PO CAPS: 15.0000 mg | ORAL_CAPSULE | Freq: Every evening | ORAL | Status: DC | PRN

## 2018-09-16 MED ORDER — MIDAZOLAM HCL 2 MG/2ML IJ SOLN
INTRAMUSCULAR | Status: AC
  Filled 2018-09-16: qty 2

## 2018-09-16 MED ORDER — MAGNESIUM CITRATE PO SOLN: 1.0000 | Freq: Once | ORAL | Status: DC | PRN

## 2018-09-16 MED ORDER — ONDANSETRON HCL 4 MG/2ML IJ SOLN: 4.0000 mg | Freq: Four times a day (QID) | INTRAMUSCULAR | Status: DC | PRN

## 2018-09-16 MED ORDER — ONDANSETRON HCL 4 MG/2ML IJ SOLN
INTRAMUSCULAR | Status: DC | PRN
  Administered 2018-09-16: 4 mg via INTRAVENOUS

## 2018-09-16 MED ORDER — POLYETHYLENE GLYCOL 3350 17 G PO PACK: 17.0000 g | PACK | Freq: Every day | ORAL | Status: DC | PRN

## 2018-09-16 MED ORDER — OXYCODONE HCL 5 MG PO TABS
10.0000 mg | ORAL_TABLET | ORAL | Status: DC | PRN
  Administered 2018-09-17 (×2): 10 mg via ORAL
  Filled 2018-09-16 (×2): qty 2

## 2018-09-16 MED ORDER — BUPIVACAINE HCL (PF) 0.5 % IJ SOLN
INTRAMUSCULAR | Status: DC | PRN
  Administered 2018-09-16: 5 mL via PERINEURAL
  Administered 2018-09-16: 15 mL via PERINEURAL

## 2018-09-16 MED ORDER — PHENOL 1.4 % MT LIQD
1.0000 | OROMUCOSAL | Status: DC | PRN
  Filled 2018-09-16: qty 177

## 2018-09-16 MED ORDER — FEBUXOSTAT 40 MG PO TABS
40.0000 mg | ORAL_TABLET | Freq: Every day | ORAL | Status: DC
  Administered 2018-09-17: 40 mg via ORAL
  Filled 2018-09-16: qty 1

## 2018-09-16 MED ORDER — LACTATED RINGERS IV SOLN
INTRAVENOUS | Status: DC | PRN
  Administered 2018-09-16: 06:00:00 via INTRAVENOUS

## 2018-09-16 MED ORDER — EPHEDRINE 5 MG/ML INJ
INTRAVENOUS | Status: AC
  Filled 2018-09-16: qty 10

## 2018-09-16 MED ORDER — SUGAMMADEX SODIUM 200 MG/2ML IV SOLN
INTRAVENOUS | Status: AC
  Filled 2018-09-16: qty 2

## 2018-09-16 MED ORDER — KETAMINE HCL 10 MG/ML IJ SOLN
INTRAMUSCULAR | Status: AC
  Filled 2018-09-16: qty 1

## 2018-09-16 MED ORDER — ALUM & MAG HYDROXIDE-SIMETH 200-200-20 MG/5ML PO SUSP: 30.0000 mL | ORAL | Status: DC | PRN

## 2018-09-16 MED ORDER — ROCURONIUM BROMIDE 100 MG/10ML IV SOLN
INTRAVENOUS | Status: AC
  Filled 2018-09-16: qty 2

## 2018-09-16 MED ORDER — METHOCARBAMOL 500 MG IVPB - SIMPLE MED
500.0000 mg | Freq: Once | INTRAVENOUS | Status: AC
  Administered 2018-09-16: 500 mg via INTRAVENOUS

## 2018-09-16 MED ORDER — METOCLOPRAMIDE HCL 5 MG PO TABS: 5.0000 mg | ORAL_TABLET | Freq: Three times a day (TID) | ORAL | Status: DC | PRN

## 2018-09-16 MED ORDER — ACETAMINOPHEN 325 MG PO TABS
325.0000 mg | ORAL_TABLET | Freq: Four times a day (QID) | ORAL | Status: DC | PRN
  Administered 2018-09-17: 650 mg via ORAL
  Filled 2018-09-16: qty 2

## 2018-09-16 MED ORDER — STERILE WATER FOR IRRIGATION IR SOLN
Status: DC | PRN
  Administered 2018-09-16: 2000 mL

## 2018-09-16 MED ORDER — DOCUSATE SODIUM 100 MG PO CAPS
100.0000 mg | ORAL_CAPSULE | Freq: Two times a day (BID) | ORAL | Status: DC
  Administered 2018-09-16 – 2018-09-17 (×2): 100 mg via ORAL
  Filled 2018-09-16 (×2): qty 1

## 2018-09-16 MED ORDER — FENTANYL CITRATE (PF) 100 MCG/2ML IJ SOLN
INTRAMUSCULAR | Status: DC | PRN
  Administered 2018-09-16 (×2): 50 ug via INTRAVENOUS

## 2018-09-16 MED ORDER — METHOCARBAMOL 500 MG PO TABS
750.0000 mg | ORAL_TABLET | Freq: Four times a day (QID) | ORAL | Status: DC | PRN
  Administered 2018-09-17 (×2): 750 mg via ORAL
  Filled 2018-09-16 (×2): qty 2

## 2018-09-16 MED ORDER — ONDANSETRON HCL 4 MG/2ML IJ SOLN
INTRAMUSCULAR | Status: AC
  Filled 2018-09-16: qty 6

## 2018-09-16 SURGICAL SUPPLY — 63 items
BAG ZIPLOCK 12X15 (MISCELLANEOUS) ×2 IMPLANT
BLADE SAW SGTL 83.5X18.5 (BLADE) ×2 IMPLANT
COVER SURGICAL LIGHT HANDLE (MISCELLANEOUS) ×2 IMPLANT
COVER WAND RF STERILE (DRAPES) IMPLANT
CUP SUT UNIV REVERS 39 NEU (Shoulder) ×1 IMPLANT
DERMABOND ADVANCED (GAUZE/BANDAGES/DRESSINGS) ×1
DERMABOND ADVANCED .7 DNX12 (GAUZE/BANDAGES/DRESSINGS) ×1 IMPLANT
DRAPE INCISE IOBAN 66X45 STRL (DRAPES) IMPLANT
DRAPE ORTHO SPLIT 77X108 STRL (DRAPES) ×2
DRAPE SURG 17X11 SM STRL (DRAPES) ×2 IMPLANT
DRAPE SURG ORHT 6 SPLT 77X108 (DRAPES) ×2 IMPLANT
DRAPE U-SHAPE 47X51 STRL (DRAPES) ×2 IMPLANT
DRSG AQUACEL AG ADV 3.5X10 (GAUZE/BANDAGES/DRESSINGS) ×2 IMPLANT
DURAPREP 26ML APPLICATOR (WOUND CARE) ×2 IMPLANT
ELECT BLADE TIP CTD 4 INCH (ELECTRODE) ×2 IMPLANT
ELECT REM PT RETURN 15FT ADLT (MISCELLANEOUS) ×2 IMPLANT
FACESHIELD WRAPAROUND (MASK) ×6 IMPLANT
FACESHIELD WRAPAROUND OR TEAM (MASK) ×3 IMPLANT
GLENOID UNI REV MOD 24 +2 LAT (Joint) ×1 IMPLANT
GLENOSPHERE 36 +4 LAT/24 (Joint) ×1 IMPLANT
GLOVE BIO SURGEON STRL SZ7.5 (GLOVE) ×2 IMPLANT
GLOVE BIO SURGEON STRL SZ8 (GLOVE) ×2 IMPLANT
GLOVE SS BIOGEL STRL SZ 7 (GLOVE) ×1 IMPLANT
GLOVE SS BIOGEL STRL SZ 7.5 (GLOVE) ×1 IMPLANT
GLOVE SUPERSENSE BIOGEL SZ 7 (GLOVE) ×1
GLOVE SUPERSENSE BIOGEL SZ 7.5 (GLOVE) ×1
GOWN STRL REUS W/TWL LRG LVL3 (GOWN DISPOSABLE) ×4 IMPLANT
INSERT HUMERAL MED 39/ +3 (Shoulder) IMPLANT
INSERT MEDIUM HUMERAL 39/ +3 (Shoulder) ×1 IMPLANT
KIT BASIN OR (CUSTOM PROCEDURE TRAY) ×4 IMPLANT
KIT TURNOVER KIT A (KITS) IMPLANT
MANIFOLD NEPTUNE II (INSTRUMENTS) ×2 IMPLANT
NDL TAPERED W/ NITINOL LOOP (MISCELLANEOUS) IMPLANT
NEEDLE TAPERED W/ NITINOL LOOP (MISCELLANEOUS) ×2 IMPLANT
NS IRRIG 1000ML POUR BTL (IV SOLUTION) ×4 IMPLANT
PACK SHOULDER (CUSTOM PROCEDURE TRAY) ×2 IMPLANT
PAD ARMBOARD 7.5X6 YLW CONV (MISCELLANEOUS) ×2 IMPLANT
PIN SET MODULAR GLENOID SYSTEM (PIN) ×1 IMPLANT
PROTECTOR NERVE ULNAR (MISCELLANEOUS) ×2 IMPLANT
RESTRAINT HEAD UNIVERSAL NS (MISCELLANEOUS) ×1 IMPLANT
SCREW CENTRAL MOD 30MM (Screw) ×1 IMPLANT
SCREW PERI LOCK 5.5X16 (Screw) ×2 IMPLANT
SCREW PERI LOCK 5.5X32 (Screw) ×1 IMPLANT
SCREW PERI LOCK 5.5X36 (Screw) ×1 IMPLANT
SLING ARM FOAM STRAP LRG (SOFTGOODS) ×2 IMPLANT
SLING ARM FOAM STRAP MED (SOFTGOODS) ×2 IMPLANT
SLING ARM IMMOBILIZER LRG (SOFTGOODS) ×1 IMPLANT
SPACER SHLD UNI REV 39 +6 (Shoulder) ×1 IMPLANT
SPONGE LAP 4X18 RFD (DISPOSABLE) ×2 IMPLANT
STEM HUMERAL UNIVERS SZ8 (Stem) ×1 IMPLANT
STRIP CLOSURE SKIN 1/2X4 (GAUZE/BANDAGES/DRESSINGS) IMPLANT
SUCTION FRAZIER HANDLE 12FR (TUBING) ×1
SUCTION TUBE FRAZIER 12FR DISP (TUBING) ×1 IMPLANT
SUT MNCRL AB 3-0 PS2 18 (SUTURE) ×2 IMPLANT
SUT MON AB 2-0 CT1 36 (SUTURE) ×2 IMPLANT
SUT VIC AB 1 CT1 36 (SUTURE) ×2 IMPLANT
SUT VIC AB 2-0 CT1 27 (SUTURE) ×1
SUT VIC AB 2-0 CT1 TAPERPNT 27 (SUTURE) IMPLANT
SUTURE TAPE 1.3 40 TPR END (SUTURE) ×1 IMPLANT
SUTURETAPE 1.3 40 TPR END (SUTURE) ×4
TOWEL OR 17X26 10 PK STRL BLUE (TOWEL DISPOSABLE) ×4 IMPLANT
TOWEL OR NON WOVEN STRL DISP B (DISPOSABLE) ×2 IMPLANT
WATER STERILE IRR 1000ML POUR (IV SOLUTION) ×6 IMPLANT

## 2018-09-16 NOTE — Anesthesia Preprocedure Evaluation (Signed)
Anesthesia Evaluation  Patient identified by MRN, date of birth, ID band Patient awake    Reviewed: Allergy & Precautions, NPO status , Patient's Chart, lab work & pertinent test results  Airway Mallampati: II  TM Distance: >3 FB Neck ROM: Full    Dental no notable dental hx. (+) Teeth Intact, Dental Advisory Given   Pulmonary neg pulmonary ROS,    Pulmonary exam normal breath sounds clear to auscultation       Cardiovascular Exercise Tolerance: Good negative cardio ROS Normal cardiovascular exam Rhythm:Regular Rate:Normal     Neuro/Psych negative neurological ROS  negative psych ROS   GI/Hepatic Neg liver ROS, GERD  Medicated,  Endo/Other  Hypothyroidism   Renal/GU negative Renal ROS     Musculoskeletal negative musculoskeletal ROS (+)   Abdominal (+) + obese,   Peds  Hematology negative hematology ROS (+)   Anesthesia Other Findings   Reproductive/Obstetrics                            Anesthesia Physical Anesthesia Plan  ASA: II  Anesthesia Plan: General   Post-op Pain Management:  Regional for Post-op pain   Induction: Intravenous  PONV Risk Score and Plan: 2 and Treatment may vary due to age or medical condition, Ondansetron and Dexamethasone  Airway Management Planned: Oral ETT  Additional Equipment:   Intra-op Plan:   Post-operative Plan: Extubation in OR  Informed Consent: I have reviewed the patients History and Physical, chart, labs and discussed the procedure including the risks, benefits and alternatives for the proposed anesthesia with the patient or authorized representative who has indicated his/her understanding and acceptance.     Dental advisory given  Plan Discussed with: CRNA  Anesthesia Plan Comments: (Plus L ISB block)       Anesthesia Quick Evaluation

## 2018-09-16 NOTE — H&P (Addendum)
09/16/2018  9:53 AM  PATIENT:   Jorge Petersen  73 y.o. male  PRE-OPERATIVE DIAGNOSIS:  left rotator cuff tear arthropathy  POST-OPERATIVE DIAGNOSIS: Same  PROCEDURE: Left shoulder reverse arthroplasty utilizing a small Arthrex baseplate/+2, a 39/+4 glenosphere, a size 8 stem with a +6 spacer and a +3 polyethylene insert  SURGEON:  Samira Acero, Jorge ReaKevin M. M.D.  ASSISTANTS: Ralene Batheracy Shuford, PA-C  ANESTHESIA:   General endotracheal as well as interscalene block  EBL: 300 cc  SPECIMEN: None  Drains: None   PATIENT DISPOSITION:  PACU - hemodynamically stable.    PLAN OF CARE: Admit for overnight observation  Brief history:  Jorge Petersen is a 73 year old gentleman who is been followed for chronic and progressively increasing left shoulder pain related to end stage rotator cuff tear arthropathy.  Due to his increasing pain and functional mentation she is brought to the operating this time for planned left reverse total shoulder arthroplasty.  Preoperatively I counseled Jorge Petersen regarding treatment options as well as the potential risks versus benefits thereof.  Possible surgical complications were reviewed including the potential for bleeding, infection, neurovascular injury, persistence of pain, anesthetic complication, failure of the implant, and possible need for additional surgery.  He understands and accepts and agrees with her planned procedure.  Procedure detail:  After undergoing routine preop evaluation patient received prophylactic antibiotics and interscalene block with Exparel was established in the holding area by the anesthesia department.  Placed supine on the operating table and underwent the smooth induction of a general endotracheal anesthesia.  Placed into the beachchair position and appropriately padded and protected.  The left shoulder girdle region was sterilely prepped and draped in standard fashion.  Timeout was called.  An anterior deltopectoral approach to the left  shoulder was made through a 10 cm incision.  Skin flaps were elevated dissection carried deeply with the vein taken laterally.  The upper centimeter the pectoralis major was tenotomized to improve exposure.  Long head biceps tendon was then tenodesed at the upper border the pectoralis major.  The conjoined tendon was mobilized and retracted medially.  The rotator cuff was split towards the base of the coracoid with a subscapularis "peel" performed and the free margin was then tagged with suture tape sutures.  Capsular attachments were then divided from the anterior and inferior margins of the humeral neck and leg was delivered through the wound.  Some superior remnants of the rotator cuff were excised.  The extra medullary guide was then used to outline our proposed humeral head resection which was then performed with an oscillating saw and a metal cap was then placed over the cut proximal humeral surface.  We then exposed the glenoid using standard retractors and performed a circumferential labral resection.  A guidepin was then placed to the center of the glenoid with a 10 degree inferior tilt and the glenoid was then reamed with our central followed by peripheral reamers and then all soft tissue and bony debris was meticulously removed.  Our central drill hole was then placed and tapped.  Our +2 baseplate with 30 mm lag screw was then introduced into the glenoid with excellent fit and fixation.  All peripheral locking screws were then placed to have appropriate depth with excellent fixation achieved.  The 39/+4 glenosphere was then impacted over the baseplate and secured with our central locking screw.  Attention then returned to the proximal humerus where the canal was opened and reaming and then broached up to size 8 and then  a central reaming guide was used to prepare the metaphysis.  Trial was then placed and trial reduction was then performed.  Once this was completed the final implant was then assembled on  the back table and inserted into the humeral canal with excellent fit and fixation.  Trial reduction was again once once again performed.  Ultimately a +6 spacer and +3 polyethylene insert gave us the best soft tissue balance and the final implants were then impacted and final reduction was then performed.  Shoulder motion showed excellent stability and mobility.  The subscapularis was then repaired back to the collar of our implant and once this was completed the shoulder demonstrated easily 45 degrees of X rotation without excessive tension on the subscap repair.  Wound then copiously irrigated.  Deltopectoral interval was repaired with a series of figure-of-eight #1 Vicryl sutures.  2-0 Monocryl used for the subcu layer and intracuticular 3 Monocryl for the skin followed by Dermabond and Aquasol dressing.  Left arm was then placed into a sling and the patient was awakened extubated and taken recovery in stable condition  Trace Shuford PA-C was used as an Geophysicist/field seismologistassistant throughout this case essential for help with positioning of the patient, position extremity, tissue manipulation, implantation of the prosthesis, wound closure, and intraoperative decision-making.  Jorge ReaKevin M Arbie Reisz MD   Contact # [phone]

## 2018-09-16 NOTE — H&P (Signed)
Melynda Keller    Chief Complaint: left rotator cuff tear arthropathy HPI: The patient is a 73 y.o. male with end stage left shoulder rotator cuff tear arthropathy  Past Medical History:  Diagnosis Date  . Gout   . Hyperlipidemia   . Reflux   . Thyroid disease     Past Surgical History:  Procedure Laterality Date  . CERVICAL FUSION    . EYE SURGERY Left 07/2013   Cataract, Drain implantation  . EYE SURGERY Right 09/2013   Laser, Cataract (IOL)  . FOOT SURGERY Right 2008   Incision x 2 to release drainage  . HERNIA REPAIR  06/2017   Umbilicus  . HERNIA REPAIR  2005   Abdominal  . JOINT REPLACEMENT  07/2016   L knee replacement  . KIDNEY SURGERY Right 06/2017   Partial Nephrectomy  . KNEE ARTHROSCOPY Right 2018-Jan  . LAMINECTOMY AND MICRODISCECTOMY LUMBAR SPINE  2010   L4-L5  . RHINOPLASTY    . THYROIDECTOMY, PARTIAL      Family History  Problem Relation Age of Onset  . Heart failure Mother     Social History:  reports that he has never smoked. He has never used smokeless tobacco. He reports that he does not drink alcohol or use drugs.   Medications Prior to Admission  Medication Sig Dispense Refill  . acetaminophen (TYLENOL) 500 MG tablet Take 1,000 mg by mouth every 6 (six) hours as needed for moderate pain or headache.    Marland Kitchen aspirin EC 81 MG tablet Take 81 mg by mouth daily.    . febuxostat (ULORIC) 40 MG tablet Take 40 mg by mouth daily.     . fluorometholone (FLAREX) 0.1 % ophthalmic suspension Place 1 drop into the left eye 2 (two) times daily.    . Magnesium Oxide 420 MG TABS Take 420 mg by mouth 2 (two) times daily.    . methocarbamol (ROBAXIN) 750 MG tablet Take 750 mg by mouth 3 (three) times daily.    . Multiple Vitamins-Minerals (PRESERVISION AREDS 2 PO) Take 1 capsule by mouth daily.    . ranitidine (ZANTAC) 150 MG tablet Take 150 mg by mouth 2 (two) times daily.    . rosuvastatin (CRESTOR) 10 MG tablet Take 10 mg by mouth at bedtime.        Physical Exam: Left shoulder demonstrates painful and restricted motion as noted at recent office visits  Vitals  Temp:  [98.5 F (36.9 C)] 98.5 F (36.9 C) (01/23 0627) Pulse Rate:  [68] 68 (01/23 0627) Resp:  [16] 16 (01/23 0627) BP: (132)/(80) 132/80 (01/23 0627) SpO2:  [99 %] 99 % (01/23 0627) Weight:  [98.7 kg] 98.7 kg (01/23 0554)  Assessment/Plan  Impression: left rotator cuff tear arthropathy  Plan of Action: Procedure(s): REVERSE SHOULDER ARTHROPLASTY  Casson Catena M Chastity Noland 09/16/2018, 6:45 AM Contact # [phone]

## 2018-09-16 NOTE — Transfer of Care (Signed)
Immediate Anesthesia Transfer of Care Note  Patient: Jorge Petersen  Procedure(s) Performed: Procedure(s) with comments: REVERSE SHOULDER ARTHROPLASTY (Left) -  Patient Location: PACU  Anesthesia Type:General  Level of Consciousness:  sedated, patient cooperative and responds to stimulation  Airway & Oxygen Therapy:Patient Spontanous Breathing and Patient connected to face mask oxgen  Post-op Assessment:  Report given to PACU RN and Post -op Vital signs reviewed and stable  Post vital signs:  Reviewed and stable  Last Vitals:  Vitals:   09/16/18 0627 09/16/18 0953  BP: 132/80   Pulse: 68   Resp: 16 13  Temp: 36.9 C 36.7 C  SpO2: 99%     Complications: No apparent anesthesia complications

## 2018-09-16 NOTE — Anesthesia Procedure Notes (Signed)
Procedure Name: Intubation Date/Time: 09/16/2018 7:45 AM Performed by: Lavina Hamman, CRNA Pre-anesthesia Checklist: Patient identified, Emergency Drugs available, Suction available, Patient being monitored and Timeout performed Patient Re-evaluated:Patient Re-evaluated prior to induction Oxygen Delivery Method: Circle system utilized Preoxygenation: Pre-oxygenation with 100% oxygen Induction Type: IV induction Ventilation: Mask ventilation without difficulty, Two handed mask ventilation required and Oral airway inserted - appropriate to patient size Laryngoscope Size: Mac and 4 Grade View: Grade I Tube type: Oral Tube size: 7.5 mm Number of attempts: 1 Airway Equipment and Method: Stylet Placement Confirmation: ETT inserted through vocal cords under direct vision,  positive ETCO2,  CO2 detector and breath sounds checked- equal and bilateral Secured at: 22 cm Tube secured with: Tape Dental Injury: Teeth and Oropharynx as per pre-operative assessment

## 2018-09-16 NOTE — Anesthesia Procedure Notes (Signed)
Anesthesia Regional Block: Interscalene brachial plexus block   Pre-Anesthetic Checklist: ,, timeout performed, Correct Patient, Correct Site, Correct Laterality, Correct Procedure, Correct Position, site marked, Risks and benefits discussed,  Surgical consent,  Pre-op evaluation,  At surgeon's request and post-op pain management  Laterality: Upper and Left  Prep: Maximum Sterile Barrier Precautions used, chloraprep       Needles:  Injection technique: Single-shot  Needle Type: Echogenic Needle     Needle Length: 5cm  Needle Gauge: 21     Additional Needles:   Procedures:,,,, ultrasound used (permanent image in chart),,,,  Narrative:  Start time: 09/16/2018 7:06 AM End time: 09/16/2018 7:13 AM Injection made incrementally with aspirations every 5 mL.  Performed by: Personally  Anesthesiologist: Trevor Iha, MD  Additional Notes: Block assessed prior to procedure. Patient tolerated procedure well.

## 2018-09-16 NOTE — Anesthesia Postprocedure Evaluation (Signed)
Anesthesia Post Note  Patient: Jorge Petersen  Procedure(s) Performed: REVERSE SHOULDER ARTHROPLASTY (Left Shoulder)     Patient location during evaluation: PACU Anesthesia Type: General and Regional Level of consciousness: awake and alert Pain management: pain level controlled Vital Signs Assessment: post-procedure vital signs reviewed and stable Respiratory status: spontaneous breathing, nonlabored ventilation, respiratory function stable and patient connected to nasal cannula oxygen Cardiovascular status: blood pressure returned to baseline and stable Postop Assessment: no apparent nausea or vomiting Anesthetic complications: no    Last Vitals:  Vitals:   09/16/18 1338 09/16/18 1443  BP: (!) 98/57 127/70  Pulse: 90 94  Resp:  17  Temp: 36.6 C 36.5 C  SpO2: 98% 99%    Last Pain:  Vitals:   09/16/18 1443  TempSrc: Oral  PainSc:                  Trevor Iha

## 2018-09-16 NOTE — Op Note (Signed)
Francena HanlySupple, Mohab Ashby, MD  Physician  Orthopedics  H&P  Addendum  Date of Service:  09/16/2018 9:53 AM             Show:Clear all [x] Manual[x] Template[] Copied  Added by: [x] Francena HanlySupple, Markez Dowland, MD  [] Hover for details 09/16/2018  9:53 AM  PATIENT:   Jorge Petersen  73 y.o. male  PRE-OPERATIVE DIAGNOSIS:  left rotator cuff tear arthropathy  POST-OPERATIVE DIAGNOSIS: Same  PROCEDURE: Left shoulder reverse arthroplasty utilizing a small Arthrex baseplate/+2, a 39/+4 glenosphere, a size 8 stem with a +6 spacer and a +3 polyethylene insert  SURGEON:  Nina Mondor, Vania ReaKevin M. M.D.  ASSISTANTS: Ralene Batheracy Shuford, PA-C  ANESTHESIA:   General endotracheal as well as interscalene block  EBL: 300 cc  SPECIMEN: None  Drains: None   PATIENT DISPOSITION:  PACU - hemodynamically stable.    PLAN OF CARE: Admit for overnight observation  Brief history:  Mr. Jorge Petersen is a 73 year old gentleman who is been followed for chronic and progressively increasing left shoulder pain related to end stage rotator cuff tear arthropathy.  Due to his increasing pain and functional mentation she is brought to the operating this time for planned left reverse total shoulder arthroplasty.  Preoperatively I counseled Mr. Jorge Petersen regarding treatment options as well as the potential risks versus benefits thereof.  Possible surgical complications were reviewed including the potential for bleeding, infection, neurovascular injury, persistence of pain, anesthetic complication, failure of the implant, and possible need for additional surgery.  He understands and accepts and agrees with her planned procedure.  Procedure detail:  After undergoing routine preop evaluation patient received prophylactic antibiotics and interscalene block with Exparel was established in the holding area by the anesthesia department.  Placed supine on the operating table and underwent the smooth induction of a general endotracheal  anesthesia.  Placed into the beachchair position and appropriately padded and protected.  The left shoulder girdle region was sterilely prepped and draped in standard fashion.  Timeout was called.  An anterior deltopectoral approach to the left shoulder was made through a 10 cm incision.  Skin flaps were elevated dissection carried deeply with the vein taken laterally.  The upper centimeter the pectoralis major was tenotomized to improve exposure.  Long head biceps tendon was then tenodesed at the upper border the pectoralis major.  The conjoined tendon was mobilized and retracted medially.  The rotator cuff was split towards the base of the coracoid with a subscapularis "peel" performed and the free margin was then tagged with suture tape sutures.  Capsular attachments were then divided from the anterior and inferior margins of the humeral neck and leg was delivered through the wound.  Some superior remnants of the rotator cuff were excised.  The extra medullary guide was then used to outline our proposed humeral head resection which was then performed with an oscillating saw and a metal cap was then placed over the cut proximal humeral surface.  We then exposed the glenoid using standard retractors and performed a circumferential labral resection.  A guidepin was then placed to the center of the glenoid with a 10 degree inferior tilt and the glenoid was then reamed with our central followed by peripheral reamers and then all soft tissue and bony debris was meticulously removed.  Our central drill hole was then placed and tapped.  Our +2 baseplate with 30 mm lag screw was then introduced into the glenoid with excellent fit and fixation.  All peripheral locking screws were then placed to have appropriate depth with  excellent fixation achieved.  The 39/+4 glenosphere was then impacted over the baseplate and secured with our central locking screw.  Attention then returned to the proximal humerus where the canal was  opened and reaming and then broached up to size 8 and then a central reaming guide was used to prepare the metaphysis.  Trial was then placed and trial reduction was then performed.  Once this was completed the final implant was then assembled on the back table and inserted into the humeral canal with excellent fit and fixation.  Trial reduction was again once once again performed.  Ultimately a +6 spacer and +3 polyethylene insert gave [place] the best soft tissue balance and the final implants were then impacted and final reduction was then performed.  Shoulder motion showed excellent stability and mobility.  The subscapularis was then repaired back to the collar of our implant and once this was completed the shoulder demonstrated easily 45 degrees of X rotation without excessive tension on the subscap repair.  Wound then copiously irrigated.  Deltopectoral interval was repaired with a series of figure-of-eight #1 Vicryl sutures.  2-0 Monocryl used for the subcu layer and intracuticular 3 Monocryl for the skin followed by Dermabond and Aquasol dressing.  Left arm was then placed into a sling and the patient was awakened extubated and taken recovery in stable condition  Trace Shuford PA-C was used as an Geophysicist/field seismologist throughout this case essential for help with positioning of the patient, position extremity, tissue manipulation, implantation of the prosthesis, wound closure, and intraoperative decision-making.  Vania Rea Deauna Yaw MD   Contact # [phone]

## 2018-09-16 NOTE — Discharge Instructions (Signed)
° °Kevin M. Supple, M.D., F.A.A.O.S. °Orthopaedic Surgery °Specializing in Arthroscopic and Reconstructive °Surgery of the Shoulder °[phone] °[address] - [place], [place] [address] - Fax [phone] ° ° °POST-OP TOTAL SHOULDER REPLACEMENT INSTRUCTIONS ° °1. Call the office at [phone] to schedule your first post-op appointment 10-14 days from the date of your surgery. ° °2. The bandage over your incision is waterproof. You may begin showering with this dressing on. You may leave this dressing on until first follow up appointment within 2 weeks. We prefer you leave this dressing in place until follow up however after 5-7 days if you are having itching or skin irritation and would like to remove it you may do so. Go slow and tug at the borders gently to break the bond the dressing has with the skin. At this point if there is no drainage it is okay to go without a bandage or you may cover it with a light guaze and tape. You can also expect significant bruising around your shoulder that will drift down your arm and into your chest wall. This is very normal and should resolve over several days. ° ° 3. Wear your sling/immobilizer at all times except to perform the exercises below or to occasionally let your arm dangle by your side to stretch your elbow. You also need to sleep in your sling immobilizer until instructed otherwise. It is ok to remove your sling if you are sitting in a controlled environment and allow your arm to rest in a position of comfort by your side or on your lap with pillows to give your neck and skin a break from the sling. You may remove it to allow arm to dangle by side to shower. If you are up walking around and when you go to sleep at night you need to wear it. ° °4. Range of motion to your elbow, wrist, and hand are encouraged 3-5 times daily. Exercise to your hand and fingers helps to reduce swelling you may experience. ° °5. Utilize ice to the shoulder 3-5 times  minimum a day and additionally if you are experiencing pain. ° °6. Prescriptions for a pain medication and a muscle relaxant are provided for you. It is recommended that if you are experiencing pain that you pain medication alone is not controlling, add the muscle relaxant along with the pain medication which can give additional pain relief. The first 1-2 days is generally the most severe of your pain and then should gradually decrease. As your pain lessens it is recommended that you decrease your use of the pain medications to an "as needed basis'" only and to always comply with the recommended dosages of the pain medications. ° °7. Pain medications can produce constipation along with their use. If you experience this, the use of an over the counter stool softener or laxative daily is recommended.  ° °8. For additional questions or concerns, please do not hesitate to call the office. If after hours there is an answering service to forward your concerns to the physician on call. ° °9.Pain control following an exparel block ° °To help control your post-operative pain you received a nerve block  performed with Exparel which is a long acting anesthetic (numbing agent) which can provide pain relief and sensations of numbness (and relief of pain) in the operative shoulder and arm for up to 3 days. Sometimes it provides mixed relief, meaning you may still have numbness in certain areas of the arm but can still   be able to move  parts of that arm, hand, and fingers. We recommend that your prescribed pain medications  be used as needed. We do not feel it is necessary to "pre medicate" and "stay ahead" of pain.  Taking narcotic pain medications when you are not having any pain can lead to unnecessary and potentially dangerous side effects.   ° °POST-OP EXERCISES ° °Pendulum Exercises ° °Perform pendulum exercises while standing and bending at the waist. Support your uninvolved arm on a table or chair and allow your operated  arm to hang freely. Make sure to do these exercises passively - not using you shoulder muscles. ° °Repeat 20 times. Do 3 sessions per day. ° ° ° ° °

## 2018-09-17 MED ORDER — OXYCODONE-ACETAMINOPHEN 5-325 MG PO TABS: 1.0000 | ORAL_TABLET | ORAL | 0 refills | Status: AC | PRN

## 2018-09-17 MED ORDER — METHOCARBAMOL 750 MG PO TABS: 750.0000 mg | ORAL_TABLET | Freq: Three times a day (TID) | ORAL | 1 refills | Status: AC | PRN

## 2018-09-17 MED ORDER — ONDANSETRON HCL 4 MG PO TABS: 4.0000 mg | ORAL_TABLET | Freq: Three times a day (TID) | ORAL | 0 refills | Status: AC | PRN

## 2018-09-17 MED FILL — Medication: Qty: 1 | Status: AC

## 2018-09-17 NOTE — Evaluation (Signed)
Occupational Therapy Evaluation Patient Details Name: Jorge KellerCurtis E Ferrall MRN: [id number] DOB: 05-08-1946 Today's Date: 09/17/2018    History of Present Illness Left shoulder reverse arthroplasty    Clinical Impression   Patient is s/p shoulder surgery resulting in the deficits listed below (see OT Problem List).  Patient will benefit from skilled OT to increase their safety and independence with ADL and functional mobility for ADL (while adhering to their precautions) to facilitate discharge to venue listed below.      Follow Up Recommendations  Follow surgeon's recommendation for DC plan and follow-up therapies    Equipment Recommendations  None recommended by OT    Recommendations for Other Services       Precautions / Restrictions Precautions Precautions: Shoulder Required Braces or Orthoses: Sling Restrictions Other Position/Activity Restrictions: f sitting in controlled environment, ok to come out of sling to give neck a break. Please sleep in it to protect until follow up in office.        OK to use operative arm for feeding, hygiene and ADLs.                  Ok to instruct Pendulums and lap slides as exercises. Ok to use operative arm within the following parameters for ADL purposes                  Mobility Bed Mobility Overal bed mobility: Needs Assistance Bed Mobility: Supine to Sit;Sit to Supine     Supine to sit: Mod assist;Max assist Sit to supine: Mod assist;Max assist   General bed mobility comments: pain limiting  Transfers Overall transfer level: Needs assistance               General transfer comment: NT        ADL either performed or assessed with clinical judgement   ADL            limited eval due to pain                                                Pertinent Vitals/Pain Pain Assessment: 0-10 Pain Score: 10-Worst pain ever Pain Descriptors / Indicators: Aching;Discomfort;Moaning;Grimacing Pain  Intervention(s): Limited activity within patient's tolerance;Premedicated before session;Repositioned;RN gave pain meds during session;Patient requesting pain meds-RN notified;Ice applied     Hand Dominance Right   Extremity/Trunk Assessment Upper Extremity Assessment Upper Extremity Assessment: LUE deficits/detail LUE Deficits / Details: s/p reverse shoulder.           Communication Communication Communication: No difficulties   Cognition Arousal/Alertness: Awake/alert Behavior During Therapy: WFL for tasks assessed/performed Overall Cognitive Status: Within Functional Limits for tasks assessed                                           Shoulder Instructions Shoulder Instructions Donning/doffing shirt without moving shoulder: Maximal assistance Method for sponge bathing under operated UE: Maximal assistance Donning/doffing sling/immobilizer: Maximal assistance Correct positioning of sling/immobilizer: Maximal assistance    Home Living Family/patient expects to be discharged to:: Private residence Living Arrangements: Spouse/significant other Available Help at Discharge: Family Type of Home: House  Prior Functioning/Environment Level of Independence: Independent                 OT Problem List: Decreased strength;Pain;Decreased knowledge of precautions;Decreased safety awareness;Decreased knowledge of use of DME or AE      OT Treatment/Interventions: Self-care/ADL training;Patient/family education;Therapeutic activities;Therapeutic exercise    OT Goals(Current goals can be found in the care plan section) Acute Rehab OT Goals Patient Stated Goal: decrease pain OT Goal Formulation: With patient Time For Goal Achievement: 09/17/18  OT Frequency: Min 2X/week              AM-PAC OT "6 Clicks" Daily Activity     Outcome Measure Help from another person eating meals?: A Lot Help from another person  taking care of personal grooming?: A Lot Help from another person toileting, which includes using toliet, bedpan, or urinal?: Total Help from another person bathing (including washing, rinsing, drying)?: Total Help from another person to put on and taking off regular upper body clothing?: A Lot Help from another person to put on and taking off regular lower body clothing?: Total 6 Click Score: 9   End of Session Nurse Communication: Mobility status;Patient requests pain meds  Activity Tolerance: Patient limited by pain Patient left: in bed;with call bell/phone within reach;with family/visitor present  OT Visit Diagnosis: Muscle weakness (generalized) (M62.81);Pain Pain - Right/Left: Left Pain - part of body: Shoulder                Time: 0945-1000 OT Time Calculation (min): 15 min Charges:  OT General Charges $OT Visit: 1 Visit OT Evaluation $OT Eval Low Complexity: 1 Low  Lise Auer, OT Acute Rehabilitation Services [phone] Office- [phone]     Carina Chaplin, Karin Golden D 09/17/2018, 1:26 PM

## 2018-09-17 NOTE — Plan of Care (Signed)
Patient discharged home in stable condition 

## 2018-09-17 NOTE — Progress Notes (Addendum)
   09/17/18 1500  OT Visit Information  Last OT Received On 09/17/18  Assistance Needed +1  History of Present Illness Left shoulder reverse arthroplasty   Precautions  Precautions Shoulder may be out of sling in controlled environment.  May use during ADLS 20 ER, 45 ABD, and 60 FF P/AROM for adls only.  Pendulums/lapslides/ elbow wrist and finger ROM allowed.  Sleep in sling and use when ambulating  Required Braces or Orthoses Sling  Pain Assessment  Pain Assessment Faces  Faces Pain Scale 6  Pain Descriptors / Indicators Aching;Discomfort;Grimacing  Pain Intervention(s) Limited activity within patient's tolerance;Monitored during session;Premedicated before session;Repositioned  Cognition  Arousal/Alertness Awake/alert  Behavior During Therapy WFL for tasks assessed/performed  Overall Cognitive Status Within Functional Limits for tasks assessed  Upper Extremity Assessment  LUE Deficits / Details able to perform lapslides, gentle pendulums, elbow, wrist and finger ROM  ADL  Overall ADL's  Needs assistance/impaired  Upper Body Dressing  Maximal assistance  General ADL Comments wife asked OT to show her how to don button down shirt. Pt did not want to keep it on.  States he may just have it draped over him, but he did fine when arm was threaded through. Reviewed precautions and allowed ROM during ADLs. Wife will likely help pt extensively.  He is L dominant  Bed Mobility  Supine to sit Min assist  General bed mobility comments HOB raised  Restrictions  LUE Weight Bearing NWB  Transfers  General transfer comment supervision  Exercises  Exercises Other exercises  Other Exercises  Other Exercises performed lap slides, pendulums, elbow, wrist and finger exercises  OT - End of Session  Activity Tolerance Patient tolerated treatment well  Patient left in bed;with call bell/phone within reach;with family/visitor present  OT Assessment/Plan  OT Visit Diagnosis Muscle weakness  (generalized) (M62.81);Pain  Pain - Right/Left Left  Pain - part of body Shoulder  Follow Up Recommendations Follow surgeon's recommendation for DC plan and follow-up therapies  OT Equipment None recommended by OT  AM-PAC OT "6 Clicks" Daily Activity Outcome Measure (Version 2)  Help from another person eating meals? 3  Help from another person taking care of personal grooming? 3  Help from another person toileting, which includes using toliet, bedpan, or urinal? 2  Help from another person bathing (including washing, rinsing, drying)? 2  Help from another person to put on and taking off regular upper body clothing? 2  Help from another person to put on and taking off regular lower body clothing? 1  6 Click Score 13  OT Goal Progression  Progress towards OT goals Progressing toward goals (pt/wife feel comfortable with education)  OT Time Calculation  OT Start Time (ACUTE ONLY) 1413  OT Stop Time (ACUTE ONLY) 1447  OT Time Calculation (min) 34 min  OT General Charges  $OT Visit 1 Visit  OT Treatments  $Self Care/Home Management  8-22 mins  $Therapeutic Exercise 8-22 mins  Jorge Petersen, OTR/L Acute Rehabilitation Services [phone] [facility] pager [phone] office 09/17/2018

## 2018-09-17 NOTE — Care Management Note (Signed)
Case Management Note  Patient Details  Name: JACEION HUESCA MRN: [id number] Date of Birth: [birth year]  Subjective/Objective:                  discharged  Action/Plan: To go back home with family/ has no dme or hhc needs  Expected Discharge Date:  09/17/18               Expected Discharge Plan:     In-House Referral:     Discharge planning Services     Post Acute Care Choice:    Choice offered to:     DME Arranged:    DME Agency:     HH Arranged:    HH Agency:     Status of Service:     If discussed at [employer] of Stay Meetings, dates discussed:    Additional Comments:  Golda Acre, RN 09/17/2018, 9:52 AM

## 2018-09-17 NOTE — Discharge Summary (Signed)
PATIENT ID:      Jorge Petersen  MRN:     [id number] DOB/AGE:    1946-05-27 / 73 y.o.     DISCHARGE SUMMARY  ADMISSION DATE:    09/16/2018 DISCHARGE DATE:    ADMISSION DIAGNOSIS: left rotator cuff tear arthropathy Past Medical History:  Diagnosis Date  . Gout   . Hyperlipidemia   . Reflux   . Thyroid disease     DISCHARGE DIAGNOSIS:   Active Problems:   S/P reverse total shoulder arthroplasty, left   PROCEDURE: Procedure(s): REVERSE SHOULDER ARTHROPLASTY on 09/16/2018  CONSULTS:    HISTORY:  See H&P in chart.  HOSPITAL COURSE:  Jorge Petersen is a 73 y.o. admitted on 09/16/2018 with a diagnosis of left rotator cuff tear arthropathy.  They were brought to the operating room on 09/16/2018 and underwent Procedure(s): REVERSE SHOULDER ARTHROPLASTY.    They were given perioperative antibiotics:  Anti-infectives (From admission, onward)   Start     Dose/Rate Route Frequency Ordered Stop   09/16/18 0600  ceFAZolin (ANCEF) IVPB 2g/100 mL premix     2 g 200 mL/hr over 30 Minutes Intravenous On call to O.R. 09/16/18 0543 09/16/18 0745    .  Patient underwent the above named procedure and tolerated it well. The following day they were hemodynamically stable and pain was controlled on oral analgesics. They were neurovascularly intact to the operative extremity. OT was ordered and worked with patient per protocol. They were medically and orthopaedically stable for discharge on .    DIAGNOSTIC STUDIES:  RECENT RADIOGRAPHIC STUDIES :  No results found.  RECENT VITAL SIGNS:   Patient Vitals for the past 24 hrs:  BP Temp Temp src Pulse Resp SpO2  09/17/18 0535 125/69 98.5 F (36.9 C) Oral 69 18 98 %  09/17/18 0152 132/73 98.9 F (37.2 C) Oral 83 18 98 %  09/16/18 2144 132/65 98.8 F (37.1 C) Oral 94 18 99 %  09/16/18 1549 120/75 97.9 F (36.6 C) Oral 92 16 98 %  09/16/18 1443 127/70 97.7 F (36.5 C) Oral 94 17 99 %  09/16/18 1338 (!) 98/57 97.9 F (36.6 C) Oral 90 - 98 %   09/16/18 1237 115/71 (!) 97.4 F (36.3 C) Oral 81 - 99 %  09/16/18 1200 123/72 (!) 97.5 F (36.4 C) - 63 14 99 %  09/16/18 1145 112/75 - - 61 12 99 %  09/16/18 1130 112/67 - - 72 15 99 %  09/16/18 1100 112/73 97.8 F (36.6 C) - 75 14 99 %  09/16/18 1055 115/69 - - 63 12 99 %  09/16/18 1050 123/73 - - 73 12 98 %  09/16/18 1045 117/74 - - 75 13 98 %  09/16/18 1040 126/77 - - 71 17 98 %  09/16/18 1035 133/76 - - 76 (!) 21 100 %  09/16/18 1030 (!) 143/90 - - 79 13 100 %  09/16/18 1025 (!) 147/83 - - 80 12 100 %  09/16/18 1015 132/80 - - 83 (!) 21 100 %  09/16/18 1000 127/84 - - 78 18 100 %  09/16/18 0953 (!) 142/85 98.1 F (36.7 C) - 83 13 100 %  .  RECENT EKG RESULTS:   No orders found for this or any previous visit.  DISCHARGE INSTRUCTIONS:    DISCHARGE MEDICATIONS:   Allergies as of 09/17/2018      Reactions   Morphine Other (See Comments)   Unknown   Nsaids    Cannot take due  to kidney function   Penicillins Rash, Other (See Comments)   GI Upset DID THE REACTION INVOLVE: Swelling of the face/tongue/throat, SOB, or low BP? No Sudden or severe rash/hives, skin peeling, or the inside of the mouth or nose? Yes Did it require medical treatment? No When did it last happen? Long time ago If all above answers are "NO", may proceed with cephalosporin use.      Medication List    TAKE these medications   acetaminophen 500 MG tablet Commonly known as:  TYLENOL Take 1,000 mg by mouth every 6 (six) hours as needed for moderate pain or headache.   aspirin EC 81 MG tablet Take 81 mg by mouth daily.   febuxostat 40 MG tablet Commonly known as:  ULORIC Take 40 mg by mouth daily.   FLAREX 0.1 % ophthalmic suspension Generic drug:  fluorometholone Place 1 drop into the left eye 2 (two) times daily.   Magnesium Oxide 420 MG Tabs Take 420 mg by mouth 2 (two) times daily.   methocarbamol 750 MG tablet Commonly known as:  ROBAXIN Take 1 tablet (750 mg total) by mouth every  8 (eight) hours as needed for muscle spasms. What changed:    when to take this  reasons to take this   ondansetron 4 MG tablet Commonly known as:  ZOFRAN Take 1 tablet (4 mg total) by mouth every 8 (eight) hours as needed for nausea or vomiting.   oxyCODONE-acetaminophen 5-325 MG tablet Commonly known as:  PERCOCET Take 1 tablet by mouth every 4 (four) hours as needed (max 6 q).   PRESERVISION AREDS 2 PO Take 1 capsule by mouth daily.   ranitidine 150 MG tablet Commonly known as:  ZANTAC Take 150 mg by mouth 2 (two) times daily.   rosuvastatin 10 MG tablet Commonly known as:  CRESTOR Take 10 mg by mouth at bedtime.       FOLLOW UP VISIT:   Follow-up Information    Francena Hanly, MD.   Specialty:  Orthopedic Surgery Why:  call to be seen in 10-14 days Contact information: [address] [place] [address] [phone]           DISCHARGE PV:[id number]   DISCHARGE CONDITION:  Jorge Petersen for Dr. Francena Hanly 09/17/2018, 9:06 AM

## 2018-09-19 ENCOUNTER — Encounter (HOSPITAL_COMMUNITY): Payer: Self-pay | Admitting: Orthopedic Surgery
